# Patient Record
Sex: Female | Born: 1950 | Race: White | Hispanic: No | Smoking: Never smoker
Health system: Southern US, Community
[De-identification: ages and names within clinical notes are randomized; demographics above are authoritative.]

## PROBLEM LIST (undated history)

## (undated) DIAGNOSIS — N200 Calculus of kidney: Secondary | ICD-10-CM

## (undated) DIAGNOSIS — I219 Acute myocardial infarction, unspecified: Secondary | ICD-10-CM

## (undated) DIAGNOSIS — M1990 Unspecified osteoarthritis, unspecified site: Secondary | ICD-10-CM

## (undated) DIAGNOSIS — C439 Malignant melanoma of skin, unspecified: Secondary | ICD-10-CM

## (undated) DIAGNOSIS — I10 Essential (primary) hypertension: Secondary | ICD-10-CM

## (undated) DIAGNOSIS — E785 Hyperlipidemia, unspecified: Secondary | ICD-10-CM

## (undated) DIAGNOSIS — R198 Other specified symptoms and signs involving the digestive system and abdomen: Secondary | ICD-10-CM

## (undated) HISTORY — DX: Malignant melanoma of skin, unspecified: C43.9

## (undated) HISTORY — DX: Other specified symptoms and signs involving the digestive system and abdomen: R19.8

## (undated) HISTORY — DX: Acute myocardial infarction, unspecified: I21.9

## (undated) HISTORY — DX: Essential (primary) hypertension: I10

---

## 2000-09-02 DIAGNOSIS — I10 Essential (primary) hypertension: Secondary | ICD-10-CM

## 2000-09-02 HISTORY — DX: Essential (primary) hypertension: I10

## 2002-09-02 DIAGNOSIS — I219 Acute myocardial infarction, unspecified: Secondary | ICD-10-CM

## 2002-09-02 HISTORY — PX: OTHER SURGICAL HISTORY: SHX169

## 2002-09-02 HISTORY — DX: Acute myocardial infarction, unspecified: I21.9

## 2004-06-02 ENCOUNTER — Encounter: Payer: Self-pay | Admitting: Cardiology

## 2004-07-03 ENCOUNTER — Encounter: Payer: Self-pay | Admitting: Cardiology

## 2004-08-02 ENCOUNTER — Encounter: Payer: Self-pay | Admitting: Cardiology

## 2004-09-02 ENCOUNTER — Encounter: Payer: Self-pay | Admitting: Cardiology

## 2004-09-02 HISTORY — PX: COLONOSCOPY: SHX174

## 2004-09-02 HISTORY — PX: UPPER GI ENDOSCOPY: SHX6162

## 2004-10-03 ENCOUNTER — Encounter: Payer: Self-pay | Admitting: Cardiology

## 2004-10-31 ENCOUNTER — Encounter: Payer: Self-pay | Admitting: Cardiology

## 2004-12-01 ENCOUNTER — Encounter: Payer: Self-pay | Admitting: Cardiology

## 2004-12-31 ENCOUNTER — Encounter: Payer: Self-pay | Admitting: Cardiology

## 2005-01-31 ENCOUNTER — Encounter: Payer: Self-pay | Admitting: Cardiology

## 2005-03-02 ENCOUNTER — Encounter: Payer: Self-pay | Admitting: Cardiology

## 2005-04-02 ENCOUNTER — Encounter: Payer: Self-pay | Admitting: Cardiology

## 2005-05-03 ENCOUNTER — Encounter: Payer: Self-pay | Admitting: Cardiology

## 2005-08-16 ENCOUNTER — Ambulatory Visit: Payer: Self-pay | Admitting: General Surgery

## 2005-08-20 ENCOUNTER — Ambulatory Visit: Payer: Self-pay | Admitting: Internal Medicine

## 2006-08-21 ENCOUNTER — Ambulatory Visit: Payer: Self-pay | Admitting: Internal Medicine

## 2007-08-25 ENCOUNTER — Ambulatory Visit: Payer: Self-pay | Admitting: Internal Medicine

## 2008-08-30 ENCOUNTER — Ambulatory Visit: Payer: Self-pay | Admitting: Internal Medicine

## 2009-01-04 ENCOUNTER — Ambulatory Visit: Payer: Self-pay | Admitting: Dermatology

## 2009-08-13 ENCOUNTER — Ambulatory Visit: Payer: Self-pay | Admitting: Family Medicine

## 2009-09-02 HISTORY — PX: LITHOTRIPSY: SUR834

## 2009-09-04 ENCOUNTER — Ambulatory Visit: Payer: Self-pay | Admitting: Internal Medicine

## 2010-07-02 ENCOUNTER — Emergency Department: Payer: Self-pay | Admitting: Unknown Physician Specialty

## 2010-07-17 ENCOUNTER — Ambulatory Visit: Payer: Self-pay | Admitting: Urology

## 2010-07-30 ENCOUNTER — Ambulatory Visit: Payer: Self-pay | Admitting: Urology

## 2010-08-02 ENCOUNTER — Ambulatory Visit: Payer: Self-pay | Admitting: Urology

## 2010-08-17 ENCOUNTER — Ambulatory Visit: Payer: Self-pay | Admitting: Urology

## 2010-08-21 ENCOUNTER — Ambulatory Visit: Payer: Self-pay | Admitting: Urology

## 2010-08-23 ENCOUNTER — Ambulatory Visit: Payer: Self-pay | Admitting: Urology

## 2010-08-24 ENCOUNTER — Ambulatory Visit: Payer: Self-pay | Admitting: General Surgery

## 2010-08-29 LAB — PATHOLOGY REPORT

## 2010-09-05 ENCOUNTER — Ambulatory Visit: Payer: Self-pay | Admitting: Internal Medicine

## 2010-09-14 ENCOUNTER — Ambulatory Visit: Payer: Self-pay | Admitting: Urology

## 2010-10-16 ENCOUNTER — Ambulatory Visit: Payer: Self-pay | Admitting: Urology

## 2010-10-26 ENCOUNTER — Ambulatory Visit: Payer: Self-pay | Admitting: Urology

## 2011-01-23 ENCOUNTER — Ambulatory Visit: Payer: Self-pay | Admitting: Urology

## 2011-09-09 ENCOUNTER — Ambulatory Visit: Payer: Self-pay | Admitting: Internal Medicine

## 2012-09-09 ENCOUNTER — Ambulatory Visit: Payer: Self-pay | Admitting: Internal Medicine

## 2013-03-18 ENCOUNTER — Encounter: Payer: Self-pay | Admitting: *Deleted

## 2013-09-10 ENCOUNTER — Ambulatory Visit: Payer: Self-pay | Admitting: Internal Medicine

## 2013-11-02 ENCOUNTER — Ambulatory Visit: Payer: Self-pay | Admitting: Urology

## 2013-11-02 DIAGNOSIS — R3129 Other microscopic hematuria: Secondary | ICD-10-CM | POA: Insufficient documentation

## 2013-11-19 DIAGNOSIS — N200 Calculus of kidney: Secondary | ICD-10-CM | POA: Insufficient documentation

## 2014-03-28 ENCOUNTER — Ambulatory Visit: Payer: Self-pay | Admitting: Urology

## 2014-03-31 ENCOUNTER — Ambulatory Visit: Payer: Self-pay | Admitting: Urology

## 2014-07-04 ENCOUNTER — Encounter: Payer: Self-pay | Admitting: *Deleted

## 2014-09-12 ENCOUNTER — Ambulatory Visit: Payer: Self-pay | Admitting: Internal Medicine

## 2014-10-19 ENCOUNTER — Encounter: Payer: Self-pay | Admitting: Podiatry

## 2014-10-19 ENCOUNTER — Ambulatory Visit (INDEPENDENT_AMBULATORY_CARE_PROVIDER_SITE_OTHER): Payer: 59 | Admitting: Podiatry

## 2014-10-19 ENCOUNTER — Ambulatory Visit (INDEPENDENT_AMBULATORY_CARE_PROVIDER_SITE_OTHER): Payer: 59

## 2014-10-19 ENCOUNTER — Other Ambulatory Visit: Payer: Self-pay | Admitting: *Deleted

## 2014-10-19 VITALS — BP 131/89 | HR 67 | Resp 16 | Ht 61.0 in | Wt 192.0 lb

## 2014-10-19 DIAGNOSIS — M722 Plantar fascial fibromatosis: Secondary | ICD-10-CM

## 2014-10-19 MED ORDER — METHYLPREDNISOLONE (PAK) 4 MG PO TABS: ORAL_TABLET | ORAL | Status: DC

## 2014-10-19 NOTE — Patient Instructions (Signed)

## 2014-10-19 NOTE — Progress Notes (Signed)
   Subjective:    Patient ID: Denise Jenkins, female    DOB: 04/12/1951, 64 y.o.   MRN: [id number]  HPI Comments: Left plantar heel pain since before christmas. First step down is painful , advil for pain   Foot Pain      Review of Systems  All other systems reviewed and are negative.      Objective:   Physical Exam: I have reviewed her past medical history medications allergies surgery social history and review of systems. Pulses are strongly palpable bilateral. Neurologic sensorium is intact for Sims once the monofilament. Deep tendon reflexes are intact bilateral and muscle strength is 5 over 5 dorsiflexors plantar flexors and inverters and everters all intrinsic musculature is intact. Orthopedic evaluation demonstrate all joints distal to the ankle before range of motion without crepitation. Cutaneous evaluation demonstrates supple well-hydrated cutis. Pain on palpation medial calcaneal tubercle of the left heel consistent with plantar fasciitis. Radiographs confirm a small plantar distally oriented calcaneal heel spur with a soft tissue increase in density at the plantar fascial calcaneal insertion site.        Assessment & Plan:  Assessment: Plantar fasciitis left foot.  Plan: Started her on a Medrol Dosepak. Injected her left heel today with Kenalog and local anesthetic. Placed her in a plantar fascial brace and a night splint. Discussed appropriate shoe gear stretching exercises ice therapy issue or modifications. We discussed the etiology pathology conservative versus surgical therapies. I will follow-up with her in 1 month.

## 2014-11-16 ENCOUNTER — Ambulatory Visit: Payer: 59 | Admitting: Podiatry

## 2014-12-05 ENCOUNTER — Ambulatory Visit: Payer: 59 | Admitting: Podiatry

## 2015-01-25 ENCOUNTER — Ambulatory Visit (INDEPENDENT_AMBULATORY_CARE_PROVIDER_SITE_OTHER): Payer: 59 | Admitting: Podiatry

## 2015-01-25 ENCOUNTER — Encounter: Payer: Self-pay | Admitting: Podiatry

## 2015-01-25 VITALS — BP 122/70 | HR 62 | Resp 16

## 2015-01-25 DIAGNOSIS — M722 Plantar fascial fibromatosis: Secondary | ICD-10-CM

## 2015-01-25 NOTE — Progress Notes (Signed)
She presents today for follow-up of her plantar fasciitis left heel. She states that it was getting better and then took a turn for the worse. She states that he really never goes away now hurts every step she takes.  Objective: Vital signs are stable she is alert and oriented 3. She takes tumorac as an anti-inflammatory. She doesn't like to take medication. She confirms that she still sleeps with the night splint wear today sling at utilizes ice on occasion and stays in good tennis shoes. She has pain on palpation medial continued tubercle left heel. Pulses remain palpable.  Assessment: Pain limb secondary plantar fasciitis left.  Plan: Reinjected the left heel today with lobular claims that he continue a conservative therapies discussed the need for orthotics we will follow-up with her in the near future.

## 2015-07-21 ENCOUNTER — Other Ambulatory Visit: Payer: Self-pay | Admitting: Internal Medicine

## 2015-07-21 DIAGNOSIS — Z1231 Encounter for screening mammogram for malignant neoplasm of breast: Secondary | ICD-10-CM

## 2015-09-15 ENCOUNTER — Ambulatory Visit
Admission: RE | Admit: 2015-09-15 | Discharge: 2015-09-15 | Disposition: A | Discharge status: Home / Self Care | Payer: 59 | Source: Ambulatory Visit | Attending: Internal Medicine | Admitting: Internal Medicine

## 2015-09-15 DIAGNOSIS — Z1231 Encounter for screening mammogram for malignant neoplasm of breast: Secondary | ICD-10-CM | POA: Diagnosis present

## 2016-04-03 ENCOUNTER — Other Ambulatory Visit: Payer: Self-pay | Admitting: Family

## 2016-04-03 ENCOUNTER — Ambulatory Visit
Admission: AD | Admit: 2016-04-03 | Discharge: 2016-04-03 | Disposition: A | Discharge status: Home / Self Care | Payer: Worker's Compensation | Source: Ambulatory Visit | Attending: Family | Admitting: Family

## 2016-04-03 ENCOUNTER — Ambulatory Visit
Admission: RE | Admit: 2016-04-03 | Discharge: 2016-04-03 | Disposition: A | Discharge status: Home / Self Care | Payer: Worker's Compensation | Source: Ambulatory Visit | Attending: Family | Admitting: Family

## 2016-04-03 DIAGNOSIS — T1490XA Injury, unspecified, initial encounter: Secondary | ICD-10-CM

## 2016-04-03 DIAGNOSIS — M79661 Pain in right lower leg: Secondary | ICD-10-CM | POA: Insufficient documentation

## 2016-07-02 DIAGNOSIS — I10 Essential (primary) hypertension: Secondary | ICD-10-CM | POA: Insufficient documentation

## 2016-07-02 DIAGNOSIS — R0602 Shortness of breath: Secondary | ICD-10-CM | POA: Insufficient documentation

## 2016-07-02 DIAGNOSIS — Z955 Presence of coronary angioplasty implant and graft: Secondary | ICD-10-CM | POA: Insufficient documentation

## 2016-07-02 DIAGNOSIS — I214 Non-ST elevation (NSTEMI) myocardial infarction: Secondary | ICD-10-CM | POA: Insufficient documentation

## 2016-07-22 ENCOUNTER — Other Ambulatory Visit: Payer: Self-pay | Admitting: Internal Medicine

## 2016-07-22 DIAGNOSIS — Z1231 Encounter for screening mammogram for malignant neoplasm of breast: Secondary | ICD-10-CM

## 2016-07-31 ENCOUNTER — Observation Stay
Admission: AD | Admit: 2016-07-31 | Discharge: 2016-08-01 | Disposition: A | Discharge status: Home / Self Care | Payer: 59 | Source: Ambulatory Visit | Attending: Cardiology | Admitting: Cardiology

## 2016-07-31 ENCOUNTER — Encounter: Payer: Self-pay | Admitting: *Deleted

## 2016-07-31 ENCOUNTER — Encounter
Admission: AD | Disposition: A | Discharge status: Home / Self Care | Payer: Self-pay | Source: Ambulatory Visit | Attending: Cardiology

## 2016-07-31 DIAGNOSIS — E785 Hyperlipidemia, unspecified: Secondary | ICD-10-CM | POA: Insufficient documentation

## 2016-07-31 DIAGNOSIS — I252 Old myocardial infarction: Secondary | ICD-10-CM | POA: Diagnosis not present

## 2016-07-31 DIAGNOSIS — I25110 Atherosclerotic heart disease of native coronary artery with unstable angina pectoris: Secondary | ICD-10-CM | POA: Diagnosis not present

## 2016-07-31 DIAGNOSIS — Z85820 Personal history of malignant melanoma of skin: Secondary | ICD-10-CM | POA: Diagnosis not present

## 2016-07-31 DIAGNOSIS — I10 Essential (primary) hypertension: Secondary | ICD-10-CM | POA: Insufficient documentation

## 2016-07-31 DIAGNOSIS — Z955 Presence of coronary angioplasty implant and graft: Secondary | ICD-10-CM | POA: Diagnosis not present

## 2016-07-31 DIAGNOSIS — I2510 Atherosclerotic heart disease of native coronary artery without angina pectoris: Secondary | ICD-10-CM | POA: Diagnosis present

## 2016-07-31 DIAGNOSIS — Z79899 Other long term (current) drug therapy: Secondary | ICD-10-CM | POA: Diagnosis not present

## 2016-07-31 HISTORY — PX: CARDIAC CATHETERIZATION: SHX172

## 2016-07-31 HISTORY — DX: Calculus of kidney: N20.0

## 2016-07-31 HISTORY — DX: Hyperlipidemia, unspecified: E78.5

## 2016-07-31 LAB — CREATININE, SERUM
Creatinine, Ser: 0.7 mg/dL (ref 0.44–1.00)
GFR calc non Af Amer: >60 mL/min (ref >60)

## 2016-07-31 SURGERY — LEFT HEART CATH AND CORONARY ANGIOGRAPHY
Anesthesia: Moderate Sedation

## 2016-07-31 MED ORDER — ASPIRIN 81 MG PO CHEW
CHEWABLE_TABLET | ORAL | Status: AC
  Filled 2016-07-31: qty 4

## 2016-07-31 MED ORDER — SODIUM CHLORIDE 0.9 % IV SOLN
INTRAVENOUS | Status: DC | PRN
  Administered 2016-07-31 (×2): 1.75 mg/kg/h via INTRAVENOUS

## 2016-07-31 MED ORDER — SODIUM CHLORIDE 0.9 % IV SOLN
250.0000 mL | INTRAVENOUS | Status: DC | PRN
Start: 2016-07-31 — End: 2016-08-01

## 2016-07-31 MED ORDER — ONDANSETRON HCL 4 MG/2ML IJ SOLN
INTRAMUSCULAR | Status: AC
  Filled 2016-07-31: qty 2

## 2016-07-31 MED ORDER — ATENOLOL 25 MG PO TABS
25.0000 mg | ORAL_TABLET | Freq: Every day | ORAL | Status: DC
  Administered 2016-08-01: 25 mg via ORAL
  Filled 2016-07-31 (×3): qty 1

## 2016-07-31 MED ORDER — ATROPINE SULFATE 1 MG/10ML IJ SOSY
PREFILLED_SYRINGE | INTRAMUSCULAR | Status: DC | PRN
  Administered 2016-07-31 (×2): 0.5 mg via INTRAVENOUS

## 2016-07-31 MED ORDER — ASPIRIN 81 MG PO CHEW
CHEWABLE_TABLET | ORAL | Status: DC | PRN
  Administered 2016-07-31: 324 mg via ORAL

## 2016-07-31 MED ORDER — CLOPIDOGREL BISULFATE 75 MG PO TABS
ORAL_TABLET | ORAL | Status: AC
  Filled 2016-07-31: qty 8

## 2016-07-31 MED ORDER — LABETALOL HCL 5 MG/ML IV SOLN: 10.0000 mg | INTRAVENOUS | Status: AC | PRN

## 2016-07-31 MED ORDER — CLOPIDOGREL BISULFATE 75 MG PO TABS
75.0000 mg | ORAL_TABLET | Freq: Every day | ORAL | Status: DC
  Administered 2016-08-01: 75 mg via ORAL
  Filled 2016-07-31: qty 1

## 2016-07-31 MED ORDER — SODIUM CHLORIDE 0.9 % WEIGHT BASED INFUSION
1.0000 mL/kg/h | INTRAVENOUS | Status: AC
  Administered 2016-07-31 (×2): 1 mL/kg/h via INTRAVENOUS

## 2016-07-31 MED ORDER — TIROFIBAN HCL IV 12.5 MG/250 ML
INTRAVENOUS | Status: DC | PRN
  Administered 2016-07-31: 0.15 ug/kg/min via INTRAVENOUS

## 2016-07-31 MED ORDER — SODIUM CHLORIDE 0.9 % IV SOLN: 250.0000 mL | INTRAVENOUS | Status: DC | PRN

## 2016-07-31 MED ORDER — IOPAMIDOL (ISOVUE-300) INJECTION 61%
INTRAVENOUS | Status: DC | PRN
  Administered 2016-07-31: 340 mL via INTRA_ARTERIAL

## 2016-07-31 MED ORDER — BIVALIRUDIN 250 MG IV SOLR
INTRAVENOUS | Status: AC
  Filled 2016-07-31: qty 250

## 2016-07-31 MED ORDER — ACETAMINOPHEN 325 MG PO TABS: 650.0000 mg | ORAL_TABLET | ORAL | Status: DC | PRN

## 2016-07-31 MED ORDER — SIMETHICONE 80 MG PO CHEW
80.0000 mg | CHEWABLE_TABLET | Freq: Once | ORAL | Status: AC
  Administered 2016-07-31: 80 mg via ORAL
  Filled 2016-07-31: qty 1

## 2016-07-31 MED ORDER — NITROGLYCERIN 1 MG/10 ML FOR IR/CATH LAB
INTRA_ARTERIAL | Status: DC | PRN
  Administered 2016-07-31: 300 mL via INTRACORONARY
  Administered 2016-07-31 (×3): 200 ug via INTRACORONARY

## 2016-07-31 MED ORDER — HEPARIN (PORCINE) IN NACL 2-0.9 UNIT/ML-% IJ SOLN
INTRAMUSCULAR | Status: AC
  Filled 2016-07-31: qty 500

## 2016-07-31 MED ORDER — SODIUM CHLORIDE 0.9 % WEIGHT BASED INFUSION: 1.0000 mL/kg/h | INTRAVENOUS | Status: DC

## 2016-07-31 MED ORDER — BIVALIRUDIN BOLUS VIA INFUSION - CUPID
INTRAVENOUS | Status: DC | PRN
  Administered 2016-07-31: 71.775 mg via INTRAVENOUS

## 2016-07-31 MED ORDER — SODIUM CHLORIDE 0.9% FLUSH: 3.0000 mL | INTRAVENOUS | Status: DC | PRN

## 2016-07-31 MED ORDER — MIDAZOLAM HCL 2 MG/2ML IJ SOLN
INTRAMUSCULAR | Status: AC
  Filled 2016-07-31: qty 2

## 2016-07-31 MED ORDER — ONDANSETRON HCL 4 MG/2ML IJ SOLN
4.0000 mg | Freq: Four times a day (QID) | INTRAMUSCULAR | Status: DC | PRN
  Administered 2016-07-31: 4 mg via INTRAVENOUS

## 2016-07-31 MED ORDER — ASPIRIN 81 MG PO CHEW
81.0000 mg | CHEWABLE_TABLET | Freq: Every day | ORAL | Status: DC
  Administered 2016-08-01: 81 mg via ORAL
  Filled 2016-07-31: qty 1

## 2016-07-31 MED ORDER — FENTANYL CITRATE (PF) 100 MCG/2ML IJ SOLN
INTRAMUSCULAR | Status: AC
  Filled 2016-07-31: qty 2

## 2016-07-31 MED ORDER — MIDAZOLAM HCL 2 MG/2ML IJ SOLN
INTRAMUSCULAR | Status: DC | PRN
  Administered 2016-07-31 (×2): 1 mg via INTRAVENOUS

## 2016-07-31 MED ORDER — TIROFIBAN HCL IN NACL 5-0.9 MG/100ML-% IV SOLN
0.1500 ug/kg/min | INTRAVENOUS | Status: AC
  Administered 2016-07-31 – 2016-08-01 (×3): 0.15 ug/kg/min via INTRAVENOUS
  Filled 2016-07-31 (×5): qty 100

## 2016-07-31 MED ORDER — CLOPIDOGREL BISULFATE 75 MG PO TABS
ORAL_TABLET | ORAL | Status: DC | PRN
  Administered 2016-07-31: 600 mg via ORAL

## 2016-07-31 MED ORDER — SODIUM CHLORIDE 0.9% FLUSH
3.0000 mL | Freq: Two times a day (BID) | INTRAVENOUS | Status: DC
  Administered 2016-07-31 – 2016-08-01 (×3): 3 mL via INTRAVENOUS

## 2016-07-31 MED ORDER — ROSUVASTATIN CALCIUM 20 MG PO TABS
20.0000 mg | ORAL_TABLET | Freq: Every day | ORAL | Status: DC
  Administered 2016-07-31: 20 mg via ORAL
  Filled 2016-07-31: qty 1

## 2016-07-31 MED ORDER — ASPIRIN 81 MG PO CHEW: 81.0000 mg | CHEWABLE_TABLET | ORAL | Status: DC

## 2016-07-31 MED ORDER — EZETIMIBE 10 MG PO TABS
10.0000 mg | ORAL_TABLET | Freq: Every day | ORAL | Status: DC
  Administered 2016-07-31 – 2016-08-01 (×2): 10 mg via ORAL
  Filled 2016-07-31 (×4): qty 1

## 2016-07-31 MED ORDER — SODIUM CHLORIDE 0.9 % WEIGHT BASED INFUSION: 3.0000 mL/kg/h | INTRAVENOUS | Status: DC

## 2016-07-31 MED ORDER — SIMETHICONE 80 MG PO CHEW
80.0000 mg | CHEWABLE_TABLET | Freq: Four times a day (QID) | ORAL | Status: DC | PRN
  Administered 2016-07-31 – 2016-08-01 (×2): 80 mg via ORAL
  Filled 2016-07-31 (×2): qty 1

## 2016-07-31 MED ORDER — SODIUM CHLORIDE 0.9% FLUSH: 3.0000 mL | Freq: Two times a day (BID) | INTRAVENOUS | Status: DC

## 2016-07-31 MED ORDER — FENTANYL CITRATE (PF) 100 MCG/2ML IJ SOLN
INTRAMUSCULAR | Status: DC | PRN
  Administered 2016-07-31 (×2): 25 ug via INTRAVENOUS

## 2016-07-31 MED ORDER — ONDANSETRON HCL 4 MG/2ML IJ SOLN
INTRAMUSCULAR | Status: AC
  Administered 2016-07-31: 4 mg via INTRAVENOUS
  Filled 2016-07-31: qty 2

## 2016-07-31 MED ORDER — HYDRALAZINE HCL 20 MG/ML IJ SOLN: 5.0000 mg | INTRAMUSCULAR | Status: AC | PRN

## 2016-07-31 MED ORDER — NITROGLYCERIN 5 MG/ML IV SOLN
INTRAVENOUS | Status: AC
  Filled 2016-07-31: qty 10

## 2016-07-31 MED ORDER — ATROPINE SULFATE 1 MG/10ML IJ SOSY
PREFILLED_SYRINGE | INTRAMUSCULAR | Status: AC
  Filled 2016-07-31: qty 10

## 2016-07-31 MED ORDER — MELOXICAM 7.5 MG PO TABS
15.0000 mg | ORAL_TABLET | Freq: Every day | ORAL | Status: DC
  Administered 2016-08-01: 15 mg via ORAL
  Filled 2016-07-31: qty 2
  Filled 2016-07-31: qty 1

## 2016-07-31 SURGICAL SUPPLY — 23 items
BALLN MINITREK RX 2.0X20 (BALLOONS) ×4
BALLN TREK RX 2.25X20 (BALLOONS) ×4
BALLOON MINITREK RX 2.0X20 (BALLOONS) ×2 IMPLANT
BALLOON TREK RX 2.25X20 (BALLOONS) ×2 IMPLANT
CATH 5FR JL4 DIAGNOSTIC (CATHETERS) ×4 IMPLANT
CATH 5FR JR4 DIAGNOSTIC (CATHETERS) ×3 IMPLANT
CATH 5FR PIGTAIL DIAGNOSTIC (CATHETERS) ×3 IMPLANT
CATH VISTA GUIDE 6FR JL3.5 (CATHETERS) ×4 IMPLANT
CATH VISTA GUIDE 6FR XBLAD3.5 (CATHETERS) ×4 IMPLANT
DEVICE CLOSURE MYNXGRIP 6/7F (Vascular Products) ×4 IMPLANT
DEVICE INFLAT 30 PLUS (MISCELLANEOUS) ×4 IMPLANT
KIT MANI 3VAL PERCEP (MISCELLANEOUS) ×4 IMPLANT
NEEDLE PERC 18GX7CM (NEEDLE) ×4 IMPLANT
PACK CARDIAC CATH (CUSTOM PROCEDURE TRAY) ×4 IMPLANT
SHEATH AVANTI 5FR X 11CM (SHEATH) ×4 IMPLANT
SHEATH AVANTI 6FR X 11CM (SHEATH) ×4 IMPLANT
STENT RESOLUTE INTEG 2.25X22 (Permanent Stent) ×4 IMPLANT
STENT RESOLUTE INTEG 2.5X30 (Permanent Stent) ×4 IMPLANT
WIRE ASAHI PROWATER 180CM (WIRE) ×4 IMPLANT
WIRE EMERALD 3MM-J .035X150CM (WIRE) ×4 IMPLANT
WIRE G HI TQ BMW 190 (WIRE) ×8 IMPLANT
WIRE HITORQ VERSACORE ST 145CM (WIRE) ×4 IMPLANT
WIRE ROSEN-J .035X260CM (WIRE) IMPLANT

## 2016-07-31 NOTE — Consult Note (Signed)
aggrastat 0.15 mcg/kg/min ordered to run for 18 hours post PCI per consult. Pt crcl is >46ml/min and weighs 95.7kg.  Ramond Dial, Pharm.D Clinical Pharmacist

## 2016-08-01 DIAGNOSIS — I25110 Atherosclerotic heart disease of native coronary artery with unstable angina pectoris: Secondary | ICD-10-CM | POA: Diagnosis not present

## 2016-08-01 LAB — CBC
HEMATOCRIT: 39.4 % (ref 35.0–47.0)
Hemoglobin: 13.9 g/dL (ref 12.0–16.0)
MCH: 31.7 pg (ref 26.0–34.0)
MCHC: 35.4 g/dL (ref 32.0–36.0)
MCV: 89.5 fL (ref 80.0–100.0)
PLATELETS: 187 10*3/uL (ref 150–440)
RBC: 4.4 MIL/uL (ref 3.80–5.20)
RDW: 12.7 % (ref 11.5–14.5)
WBC: 8.9 10*3/uL (ref 3.6–11.0)

## 2016-08-01 LAB — BASIC METABOLIC PANEL
Anion gap: 6 (ref 5–15)
BUN: 11 mg/dL (ref 6–20)
CHLORIDE: 108 mmol/L (ref 101–111)
CO2: 26 mmol/L (ref 22–32)
CREATININE: 0.74 mg/dL (ref 0.44–1.00)
Calcium: 8.7 mg/dL — ABNORMAL LOW (ref 8.9–10.3)
GFR calc Af Amer: >60 mL/min (ref >60)
GFR calc non Af Amer: >60 mL/min (ref >60)
Glucose, Bld: 99 mg/dL (ref 65–99)
Potassium: 3.6 mmol/L (ref 3.5–5.1)
Sodium: 140 mmol/L (ref 135–145)

## 2016-08-01 MED ORDER — CLOPIDOGREL BISULFATE 75 MG PO TABS: 75.0000 mg | ORAL_TABLET | Freq: Every day | ORAL | 5 refills | Status: DC

## 2016-08-01 NOTE — Discharge Summary (Signed)
   Physician Discharge Summary  Patient ID: Denise Jenkins MRN: [id number] DOB/AGE: Jan 31, 1951 65 y.o.  Admit date: 07/31/2016 Discharge date: 08/01/2016  Primary Discharge Diagnosis Unstable angina Secondary Discharge Diagnosis coronary artery disease  Significant Diagnostic Studies: cardiac graphics: ECG: normal sinus rhythm and yes  Consults: None  Hospital Course: The patient underwent elective cardiac catheterization on 07/31/2016. Coronary angiography revealed patent stents in the mid left circumflex and mid right coronary arteries. A new high-grade diffuse 90% stenosis was noted in the mid left anterior descending coronary artery. The patient underwent percutaneous coronary intervention receiving a 2.25 x 22 and 2.5 x 30 mm Resolute Integrity stents with an excellent angiographic result. The patient was observed on telemetry where she had an uncomplicated hospital course without recurrent chest pain. On the morning of 08/01/2016, the patient was ambulating without difficulty was discharged home.   Discharge Exam: Blood pressure 121/68, pulse 63, temperature 98 F (36.7 C), resp. rate 16, height 5\' 1"  (1.549 m), weight 95.7 kg (211 lb), SpO2 97 %.   General appearance: alert Head: atraumatic Eyes: conjunctivae/corneas clear. PERRL, EOM's intact. Fundi benign. Ears: normal TM's and external ear canals both ears Nose: Nares normal. Septum midline. Mucosa normal. No drainage or sinus tenderness. Throat: lips, mucosa, and tongue normal; teeth and gums normal Neck: no adenopathy, no carotid bruit, no JVD, supple, symmetrical, trachea midline and thyroid not enlarged, symmetric, no tenderness/mass/nodules Back: symmetric, no curvature. ROM normal. No CVA tenderness. Resp: clear to auscultation bilaterally Chest wall: no tenderness Cardio: regular rate and rhythm, S1, S2 normal, no murmur, click, rub or gallop GI: soft, non-tender; bowel sounds normal; no masses,  no  organomegaly Extremities: extremities normal, atraumatic, no cyanosis or edema Pulses: 2+ and symmetric Lymph nodes: Cervical, supraclavicular, and axillary nodes normal. Neurologic: Grossly normal Labs:   Lab Results  Component Value Date   WBC 8.9 08/01/2016   HGB 13.9 08/01/2016   HCT 39.4 08/01/2016   MCV 89.5 08/01/2016   PLT 187 08/01/2016    Recent Labs Lab 08/01/16 0445  NA 140  K 3.6  CL 108  CO2 26  BUN 11  CREATININE 0.74  CALCIUM 8.7*  GLUCOSE 99      Radiology:  EKG: Normal sinus rhythm  FOLLOW UP PLANS AND APPOINTMENTS     BRING ALL MEDICATIONS WITH YOU TO FOLLOW UP APPOINTMENTS  Time spent with patient to include physician time: 25 minutes Signed:  Isaias Cowman MD, PhD, [facility] 08/01/2016, 7:21 AM

## 2016-08-01 NOTE — Care Management (Signed)
Patient is not being discharged home on cost prohibitive anti platelet medication

## 2016-09-16 ENCOUNTER — Ambulatory Visit: Payer: 59

## 2016-09-17 ENCOUNTER — Ambulatory Visit
Admission: RE | Admit: 2016-09-17 | Discharge: 2016-09-17 | Disposition: A | Discharge status: Home / Self Care | Payer: 59 | Source: Ambulatory Visit | Attending: Internal Medicine | Admitting: Internal Medicine

## 2016-09-17 DIAGNOSIS — Z1231 Encounter for screening mammogram for malignant neoplasm of breast: Secondary | ICD-10-CM | POA: Diagnosis present

## 2017-04-09 DIAGNOSIS — M179 Osteoarthritis of knee, unspecified: Secondary | ICD-10-CM | POA: Insufficient documentation

## 2017-04-09 DIAGNOSIS — M1710 Unilateral primary osteoarthritis, unspecified knee: Secondary | ICD-10-CM | POA: Insufficient documentation

## 2017-07-30 ENCOUNTER — Other Ambulatory Visit: Payer: Self-pay | Admitting: Internal Medicine

## 2017-07-30 DIAGNOSIS — Z1231 Encounter for screening mammogram for malignant neoplasm of breast: Secondary | ICD-10-CM

## 2017-09-22 ENCOUNTER — Ambulatory Visit
Admission: RE | Admit: 2017-09-22 | Discharge: 2017-09-22 | Disposition: A | Discharge status: Home / Self Care | Payer: Medicare Other | Source: Ambulatory Visit | Attending: Internal Medicine | Admitting: Internal Medicine

## 2017-09-22 DIAGNOSIS — Z1231 Encounter for screening mammogram for malignant neoplasm of breast: Secondary | ICD-10-CM | POA: Insufficient documentation

## 2018-07-04 ENCOUNTER — Encounter: Payer: Self-pay | Admitting: General Surgery

## 2018-08-03 ENCOUNTER — Other Ambulatory Visit: Payer: Self-pay | Admitting: Internal Medicine

## 2018-08-03 DIAGNOSIS — Z1231 Encounter for screening mammogram for malignant neoplasm of breast: Secondary | ICD-10-CM

## 2018-08-06 ENCOUNTER — Ambulatory Visit: Payer: Medicare Other | Admitting: General Surgery

## 2018-08-13 ENCOUNTER — Encounter: Payer: Self-pay | Admitting: General Surgery

## 2018-08-13 ENCOUNTER — Other Ambulatory Visit: Payer: Self-pay

## 2018-08-13 ENCOUNTER — Ambulatory Visit (INDEPENDENT_AMBULATORY_CARE_PROVIDER_SITE_OTHER): Payer: Medicare Other | Admitting: General Surgery

## 2018-08-13 VITALS — BP 176/93 | HR 51 | Temp 97.7°F | Resp 20 | Ht 65.0 in | Wt 220.0 lb

## 2018-08-13 DIAGNOSIS — Z86010 Personal history of colonic polyps: Secondary | ICD-10-CM | POA: Diagnosis not present

## 2018-08-13 MED ORDER — POLYETHYLENE GLYCOL 3350 17 GM/SCOOP PO POWD: ORAL | 0 refills | Status: DC

## 2018-08-13 NOTE — Progress Notes (Signed)
Patient ID: Denise Jenkins, female   DOB: 03/24/1951, 68 y.o.   MRN: [id number]  Chief Complaint  Patient presents with  . Colonoscopy    HPI Denise Jenkins is a 67 y.o. female here today for a evaluating of a colonoscopy. Last colonoscopy was 08/24/2010. Patient states no Gi problems at this time. Moves her bowels daily . HPI  Past Medical History:  Diagnosis Date  . Heart attack ([place]) 2004  . Hyperlipidemia   . Hypertension 2002  . Kidney stones   . Melanoma ([place])   . Myocardial infarction ([place]) 2004  . Other symptoms involving digestive system(787.99)     Past Surgical History:  Procedure Laterality Date  . CARDIAC CATHETERIZATION Left 07/31/2016   Procedure: Left Heart Cath and Coronary Angiography;  Surgeon: Isaias Cowman, MD;  Location: [facility];  Service: Cardiovascular;  Laterality: Left;  . CARDIAC CATHETERIZATION N/A 07/31/2016   Procedure: Coronary Stent Intervention;  Surgeon: Isaias Cowman, MD;  Location: [facility];  Service: Cardiovascular;  Laterality: N/A;  . COLONOSCOPY  2006  . LITHOTRIPSY  2011  . stent placed in heart   2004   non-Q-wave infarction  . UPPER GI ENDOSCOPY  2006    Family History  Problem Relation Age of Onset  . Colon polyps Mother   . Colon polyps Maternal Uncle   . Breast cancer Neg Hx     Social History Social History   Tobacco Use  . Smoking status: Never Smoker  . Smokeless tobacco: Never Used  Substance Use Topics  . Alcohol use: No    Alcohol/week: 0.0 standard drinks  . Drug use: No    Allergies  Allergen Reactions  . Niacin And Related Itching  . Oxycodone Other (See Comments)    Felt "drunk"  . Sulfa Antibiotics Itching    Current Outpatient Medications  Medication Sig Dispense Refill  . atorvastatin (LIPITOR) 40 MG tablet Take 40 mg by mouth daily.    . cholecalciferol (VITAMIN D) 400 units TABS tablet Take 800 Units by mouth daily.    . clopidogrel (PLAVIX) 75 MG tablet  Take 1 tablet (75 mg total) by mouth daily with breakfast. 30 tablet 5  . Coenzyme Q10 (CO Q-10) 100 MG CAPS Take by mouth.    . desonide (DESOWEN) 0.05 % cream Apply 1 application topically 2 (two) times daily as needed.    . ezetimibe (ZETIA) 10 MG tablet Take 10 mg by mouth daily.     . meloxicam (MOBIC) 15 MG tablet Take 15 mg by mouth daily.    . mometasone (ELOCON) 0.1 % cream Apply 1 application topically daily as needed.    . Multiple Vitamin (MULTI-VITAMINS) TABS Take 1 tablet by mouth daily.     . nitroGLYCERIN (NITROSTAT) 0.4 MG SL tablet Place 0.4 mg under the tongue every 5 (five) minutes as needed for chest pain.     Marland Kitchen triamcinolone cream (KENALOG) 0.1 % Apply 1 application topically 2 (two) times daily as needed.    . polyethylene glycol powder (GLYCOLAX/MIRALAX) powder 255 grams one bottle for colonoscopy prep 255 g 0   No current facility-administered medications for this visit.     Review of Systems Review of Systems  Blood pressure (!) 176/93, pulse (!) 51, temperature 97.7 F (36.5 C), temperature source Skin, resp. rate 20, height 5\' 5"  (1.651 m), weight 220 lb (99.8 kg), SpO2 96 %.  Physical Exam Physical Exam Eyes:     General: No scleral  icterus.    Conjunctiva/sclera: Conjunctivae normal.  Cardiovascular:     Rate and Rhythm: Normal rate and regular rhythm.  Pulmonary:     Effort: Pulmonary effort is normal.     Breath sounds: Normal breath sounds.  Skin:    General: Skin is warm and dry.  Neurological:     Mental Status: She is alert and oriented to person, place, and time.     Data Reviewed August 24, 2010 colonoscopy showed a single angiodysplasia in the splenic flexure.  Prior exam in December 2006 had shown a single polyp in the rectum, pathology showed a tubular adenoma.  Upper endoscopy completed in 2006 showed inactive chronic gastritis without evidence of H. pylori infection.  Assessment    Candidate for screening colonoscopy.     Plan  Colonoscopy with possible biopsy/polypectomy prn: Information regarding the procedure, including its potential risks and complications (including but not limited to perforation of the bowel, which may require emergency surgery to repair, and bleeding) was verbally given to the patient. Educational information regarding lower intestinal endoscopy was given to the patient. Written instructions for how to complete the bowel prep using Miralax were provided. The importance of drinking ample fluids to avoid dehydration as a result of the prep emphasized.   HPI, Physical Exam, Assessment and Plan have been scribed under the direction and in the presence of Hervey Ard, MD.  Gaspar Cola, CMA  I have completed the exam and reviewed the above documentation for accuracy and completeness.  I agree with the above.  Haematologist has been used and any errors in dictation or transcription are unintentional.  Hervey Ard, M.D., F.A.C.S.  Forest Gleason Ismaeel Arvelo 08/15/2018, 9:48 AM  Patient has been scheduled for a colonoscopy on 09-09-2018 at [facility]. The patient has been asked to stop Plavix seven days prior to procedure. Miralax prescription has been sent in to the patient's pharmacy today. She has been asked to take Dulcolax 10 mg the morning and afternoon two days prior to colonoscopy (Monday, 09-07-2018). Colonoscopy instructions have been reviewed with the patient. This patient is aware to call the office if they have further questions.   Dominga Ferry, CMA

## 2018-08-13 NOTE — Patient Instructions (Signed)
Colonoscopy, Adult A colonoscopy is an exam to look at the entire large intestine. During the exam, a lubricated, bendable tube is inserted into the anus and then passed into the rectum, colon, and other parts of the large intestine. A colonoscopy is often done as a part of normal colorectal screening or in response to certain symptoms, such as anemia, persistent diarrhea, abdominal pain, and blood in the stool. The exam can help screen for and diagnose medical problems, including:  Tumors.  Polyps.  Inflammation.  Areas of bleeding.  Tell a health care provider about:  Any allergies you have.  All medicines you are taking, including vitamins, herbs, eye drops, creams, and over-the-counter medicines.  Any problems you or family members have had with anesthetic medicines.  Any blood disorders you have.  Any surgeries you have had.  Any medical conditions you have.  Any problems you have had passing stool. What are the risks? Generally, this is a safe procedure. However, problems may occur, including:  Bleeding.  A tear in the intestine.  A reaction to medicines given during the exam.  Infection (rare).  What happens before the procedure? Eating and drinking restrictions Follow instructions from your health care provider about eating and drinking, which may include:  A few days before the procedure - follow a low-fiber diet. Avoid nuts, seeds, dried fruit, raw fruits, and vegetables.  1-3 days before the procedure - follow a clear liquid diet. Drink only clear liquids, such as clear broth or bouillon, black coffee or tea, clear juice, clear soft drinks or sports drinks, gelatin dessert, and popsicles. Avoid any liquids that contain red or purple dye.  On the day of the procedure - do not eat or drink anything during the 2 hours before the procedure, or within the time period that your health care provider recommends.  Bowel prep If you were prescribed an oral bowel prep  to clean out your colon:  Take it as told by your health care provider. Starting the day before your procedure, you will need to drink a large amount of medicated liquid. The liquid will cause you to have multiple loose stools until your stool is almost clear or light green.  If your skin or anus gets irritated from diarrhea, you may use these to relieve the irritation: ? Medicated wipes, such as adult wet wipes with aloe and vitamin E. ? A skin soothing-product like petroleum jelly.  If you vomit while drinking the bowel prep, take a break for up to 60 minutes and then begin the bowel prep again. If vomiting continues and you cannot take the bowel prep without vomiting, call your health care provider.  General instructions  Ask your health care provider about changing or stopping your regular medicines. This is especially important if you are taking diabetes medicines or blood thinners.  Plan to have someone take you home from the hospital or clinic. What happens during the procedure?  An IV tube may be inserted into one of your veins.  You will be given medicine to help you relax (sedative).  To reduce your risk of infection: ? Your health care team will wash or sanitize their hands. ? Your anal area will be washed with soap.  You will be asked to lie on your side with your knees bent.  Your health care provider will lubricate a long, thin, flexible tube. The tube will have a camera and a light on the end.  The tube will be inserted into your   anus.  The tube will be gently eased through your rectum and colon.  Air will be delivered into your colon to keep it open. You may feel some pressure or cramping.  The camera will be used to take images during the procedure.  A small tissue sample may be removed from your body to be examined under a microscope (biopsy). If any potential problems are found, the tissue will be sent to a lab for testing.  If small polyps are found, your  health care provider may remove them and have them checked for cancer cells.  The tube that was inserted into your anus will be slowly removed. The procedure may vary among health care providers and hospitals. What happens after the procedure?  Your blood pressure, heart rate, breathing rate, and blood oxygen level will be monitored until the medicines you were given have worn off.  Do not drive for 24 hours after the exam.  You may have a small amount of blood in your stool.  You may pass gas and have mild abdominal cramping or bloating due to the air that was used to inflate your colon during the exam.  It is up to you to get the results of your procedure. Ask your health care provider, or the department performing the procedure, when your results will be ready. This information is not intended to replace advice given to you by your health care provider. Make sure you discuss any questions you have with your health care provider. Document Released: 08/16/2000 Document Revised: 06/19/2016 Document Reviewed: 10/31/2015 Elsevier Interactive Patient Education  2018 Elsevier Inc.  

## 2018-08-19 ENCOUNTER — Telehealth: Payer: Self-pay

## 2018-08-19 NOTE — Telephone Encounter (Signed)
Call to patient to reschedule their colonoscopy currently scheduled for 09/09/18.  Patient is now scheduled for a colonoscopy on 2/08-2019 at [facility]. The patient has been asked to stop Plavix seven days prior to procedure. Miralax prescription has been sent in to the patient's pharmacy today. She has been asked to take Dulcolax 10 mg the morning and afternoon two days prior to colonoscopy (Monday, 10/12/2018). Colonoscopy instructions have been reviewed with the patient. This patient is aware to call the office if they have further questions.

## 2018-08-20 ENCOUNTER — Encounter: Payer: Self-pay | Admitting: General Surgery

## 2018-09-23 ENCOUNTER — Other Ambulatory Visit: Payer: Self-pay

## 2018-09-23 ENCOUNTER — Telehealth: Payer: Self-pay | Admitting: *Deleted

## 2018-09-23 DIAGNOSIS — Z86010 Personal history of colonic polyps: Secondary | ICD-10-CM

## 2018-09-23 NOTE — Telephone Encounter (Signed)
Patient contacted today and notified that Dr. Bary Castilla is on leave with no anticipated RTW date. She is aware we need to cancel her colonoscopy that is scheduled for 10-14-18.  Trish in Endoscopy has been notified of cancellation.   A referral has been for the patient to see [facility] for colonoscopy. Patient aware they will contact her directly regarding an appointment.

## 2018-09-24 ENCOUNTER — Telehealth: Payer: Self-pay | Admitting: *Deleted

## 2018-09-24 ENCOUNTER — Ambulatory Visit
Admission: RE | Admit: 2018-09-24 | Discharge: 2018-09-24 | Disposition: A | Discharge status: Home / Self Care | Payer: Medicare Other | Source: Ambulatory Visit | Attending: Internal Medicine | Admitting: Internal Medicine

## 2018-09-24 DIAGNOSIS — Z1231 Encounter for screening mammogram for malignant neoplasm of breast: Secondary | ICD-10-CM

## 2018-09-24 NOTE — Telephone Encounter (Signed)
Patient notified that Dr. Bary Castilla will be returning next week.   She was given the option to have colonoscopy with Dr. Bary Castilla or Dr. Marius Ditch as scheduled.   Patient is appreciative of the call but she wishes to see Dr. Marius Ditch since she can have procedure done in [place].

## 2018-09-30 ENCOUNTER — Telehealth: Payer: Self-pay

## 2018-09-30 NOTE — Telephone Encounter (Signed)
Patient has been advised per Dr. Suzan Garibaldi fax to stop Plavix 5 days prior to procedure and restart 1 day after procedure.  Thanks [employer]

## 2018-10-06 ENCOUNTER — Encounter: Payer: Self-pay | Admitting: *Deleted

## 2018-10-06 ENCOUNTER — Other Ambulatory Visit: Payer: Self-pay

## 2018-10-08 ENCOUNTER — Telehealth: Payer: Self-pay | Admitting: Gastroenterology

## 2018-10-08 ENCOUNTER — Other Ambulatory Visit: Payer: Self-pay

## 2018-10-08 MED ORDER — NA SULFATE-K SULFATE-MG SULF 17.5-3.13-1.6 GM/177ML PO SOLN: 1.0000 | Freq: Once | ORAL | 0 refills | Status: AC

## 2018-10-08 NOTE — Telephone Encounter (Signed)
PT left vm she has procedure on 10/14/18 and her pharmacy [facility] in [place] does not have the Suprep

## 2018-10-08 NOTE — Anesthesia Preprocedure Evaluation (Addendum)
Anesthesia Evaluation  Patient identified by MRN, date of birth, ID band  Reviewed: NPO status   History of Anesthesia Complications Negative for: history of anesthetic complications  Airway Mallampati: II  TM Distance: >3 FB Neck ROM: full    Dental no notable dental hx.    Pulmonary neg pulmonary ROS,    Pulmonary exam normal        Cardiovascular Exercise Tolerance: Good hypertension, + CAD, + Past MI (2004) and + Cardiac Stents (x4 (last 2017))  Normal cardiovascular exam  Last Cardiology visit 08/2018.  Doing well LVEF > 55%   Neuro/Psych negative neurological ROS  negative psych ROS   GI/Hepatic negative GI ROS, Neg liver ROS,   Endo/Other  Morbid obesity (bmi=40)  Renal/GU Renal disease (stones)  negative genitourinary   Musculoskeletal  (+) Arthritis ,   Abdominal   Peds  Hematology negative hematology ROS (+)   Anesthesia Other Findings Cards: 08/2018: 68 year old female status post PCI with overlapping drug-eluting stents mid LAD, with 2 Cypher stents in 2004, currently clinically stable without exertional chest pain. Patient has essential hypertension, blood pressure well controlled on current BP medications.   Last plavix: 10/10/2018;  Reproductive/Obstetrics                            Anesthesia Physical Anesthesia Plan  ASA: III  Anesthesia Plan: General   Post-op Pain Management:    Induction:   PONV Risk Score and Plan:   Airway Management Planned: Natural Airway  Additional Equipment:   Intra-op Plan:   Post-operative Plan:   Informed Consent: I have reviewed the patients History and Physical, chart, labs and discussed the procedure including the risks, benefits and alternatives for the proposed anesthesia with the patient or authorized representative who has indicated his/her understanding and acceptance.       Plan Discussed with: CRNA  Anesthesia  Plan Comments:         Anesthesia Quick Evaluation

## 2018-10-08 NOTE — Telephone Encounter (Signed)
Rx sent to pharmacy for [place].  Thanks [employer]

## 2018-10-13 NOTE — Discharge Instructions (Signed)
General Anesthesia, Adult, Care After  This sheet gives you information about how to care for yourself after your procedure. Your health care provider may also give you more specific instructions. If you have problems or questions, contact your health care provider.  What can I expect after the procedure?  After the procedure, the following side effects are common:  Pain or discomfort at the IV site.  Nausea.  Vomiting.  Sore throat.  Trouble concentrating.  Feeling cold or chills.  Weak or tired.  Sleepiness and fatigue.  Soreness and body aches. These side effects can affect parts of the body that were not involved in surgery.  Follow these instructions at home:    For at least 24 hours after the procedure:  Have a responsible adult stay with you. It is important to have someone help care for you until you are awake and alert.  Rest as needed.  Do not:  Participate in activities in which you could fall or become injured.  Drive.  Use heavy machinery.  Drink alcohol.  Take sleeping pills or medicines that cause drowsiness.  Make important decisions or sign legal documents.  Take care of children on your own.  Eating and drinking  Follow any instructions from your health care provider about eating or drinking restrictions.  When you feel hungry, start by eating small amounts of foods that are soft and easy to digest (bland), such as toast. Gradually return to your regular diet.  Drink enough fluid to keep your urine pale yellow.  If you vomit, rehydrate by drinking water, juice, or clear broth.  General instructions  If you have sleep apnea, surgery and certain medicines can increase your risk for breathing problems. Follow instructions from your health care provider about wearing your sleep device:  Anytime you are sleeping, including during daytime naps.  While taking prescription pain medicines, sleeping medicines, or medicines that make you drowsy.  Return to your normal activities as told by your health care  provider. Ask your health care provider what activities are safe for you.  Take over-the-counter and prescription medicines only as told by your health care provider.  If you smoke, do not smoke without supervision.  Keep all follow-up visits as told by your health care provider. This is important.  Contact a health care provider if:  You have nausea or vomiting that does not get better with medicine.  You cannot eat or drink without vomiting.  You have pain that does not get better with medicine.  You are unable to pass urine.  You develop a skin rash.  You have a fever.  You have redness around your IV site that gets worse.  Get help right away if:  You have difficulty breathing.  You have chest pain.  You have blood in your urine or stool, or you vomit blood.  Summary  After the procedure, it is common to have a sore throat or nausea. It is also common to feel tired.  Have a responsible adult stay with you for the first 24 hours after general anesthesia. It is important to have someone help care for you until you are awake and alert.  When you feel hungry, start by eating small amounts of foods that are soft and easy to digest (bland), such as toast. Gradually return to your regular diet.  Drink enough fluid to keep your urine pale yellow.  Return to your normal activities as told by your health care provider. Ask your health care   provider what activities are safe for you.  This information is not intended to replace advice given to you by your health care provider. Make sure you discuss any questions you have with your health care provider.  Document Released: 11/25/2000 Document Revised: 04/04/2017 Document Reviewed: 04/04/2017  Elsevier Interactive Patient Education  2019 Elsevier Inc.

## 2018-10-14 ENCOUNTER — Ambulatory Visit
Admission: RE | Admit: 2018-10-14 | Discharge: 2018-10-14 | Disposition: A | Discharge status: Home / Self Care | Payer: Medicare Other | Attending: Gastroenterology | Admitting: Gastroenterology

## 2018-10-14 ENCOUNTER — Encounter: Payer: Self-pay | Admitting: Anesthesiology

## 2018-10-14 ENCOUNTER — Ambulatory Visit: Payer: Medicare Other | Admitting: Anesthesiology

## 2018-10-14 ENCOUNTER — Ambulatory Visit: Admit: 2018-10-14 | Payer: PRIVATE HEALTH INSURANCE | Admitting: General Surgery

## 2018-10-14 ENCOUNTER — Encounter
Admission: RE | Disposition: A | Discharge status: Home / Self Care | Payer: Self-pay | Source: Home / Self Care | Attending: Gastroenterology

## 2018-10-14 DIAGNOSIS — E785 Hyperlipidemia, unspecified: Secondary | ICD-10-CM | POA: Diagnosis not present

## 2018-10-14 DIAGNOSIS — Z7902 Long term (current) use of antithrombotics/antiplatelets: Secondary | ICD-10-CM | POA: Diagnosis not present

## 2018-10-14 DIAGNOSIS — Z6841 Body Mass Index (BMI) 40.0 and over, adult: Secondary | ICD-10-CM | POA: Diagnosis not present

## 2018-10-14 DIAGNOSIS — Z791 Long term (current) use of non-steroidal anti-inflammatories (NSAID): Secondary | ICD-10-CM | POA: Diagnosis not present

## 2018-10-14 DIAGNOSIS — Z885 Allergy status to narcotic agent status: Secondary | ICD-10-CM | POA: Insufficient documentation

## 2018-10-14 DIAGNOSIS — D120 Benign neoplasm of cecum: Secondary | ICD-10-CM

## 2018-10-14 DIAGNOSIS — Z86010 Personal history of colonic polyps: Secondary | ICD-10-CM

## 2018-10-14 DIAGNOSIS — Z1211 Encounter for screening for malignant neoplasm of colon: Secondary | ICD-10-CM

## 2018-10-14 DIAGNOSIS — Z7982 Long term (current) use of aspirin: Secondary | ICD-10-CM | POA: Diagnosis not present

## 2018-10-14 DIAGNOSIS — I252 Old myocardial infarction: Secondary | ICD-10-CM | POA: Insufficient documentation

## 2018-10-14 DIAGNOSIS — Z79899 Other long term (current) drug therapy: Secondary | ICD-10-CM | POA: Diagnosis not present

## 2018-10-14 DIAGNOSIS — Z955 Presence of coronary angioplasty implant and graft: Secondary | ICD-10-CM | POA: Insufficient documentation

## 2018-10-14 DIAGNOSIS — Z882 Allergy status to sulfonamides status: Secondary | ICD-10-CM | POA: Diagnosis not present

## 2018-10-14 DIAGNOSIS — Z85820 Personal history of malignant melanoma of skin: Secondary | ICD-10-CM | POA: Diagnosis not present

## 2018-10-14 DIAGNOSIS — K5730 Diverticulosis of large intestine without perforation or abscess without bleeding: Secondary | ICD-10-CM | POA: Diagnosis not present

## 2018-10-14 DIAGNOSIS — Z888 Allergy status to other drugs, medicaments and biological substances status: Secondary | ICD-10-CM | POA: Insufficient documentation

## 2018-10-14 DIAGNOSIS — I10 Essential (primary) hypertension: Secondary | ICD-10-CM | POA: Insufficient documentation

## 2018-10-14 DIAGNOSIS — I2510 Atherosclerotic heart disease of native coronary artery without angina pectoris: Secondary | ICD-10-CM | POA: Insufficient documentation

## 2018-10-14 HISTORY — PX: POLYPECTOMY: SHX5525

## 2018-10-14 HISTORY — DX: Unspecified osteoarthritis, unspecified site: M19.90

## 2018-10-14 HISTORY — PX: COLONOSCOPY WITH PROPOFOL: SHX5780

## 2018-10-14 SURGERY — COLONOSCOPY WITH PROPOFOL
Anesthesia: General

## 2018-10-14 SURGERY — COLONOSCOPY WITH PROPOFOL
Anesthesia: General | Site: Rectum

## 2018-10-14 MED ORDER — STERILE WATER FOR IRRIGATION IR SOLN
Status: DC | PRN
  Administered 2018-10-14: 09:00:00

## 2018-10-14 MED ORDER — LIDOCAINE HCL (CARDIAC) PF 100 MG/5ML IV SOSY
PREFILLED_SYRINGE | INTRAVENOUS | Status: DC | PRN
  Administered 2018-10-14: 40 mg via INTRAVENOUS

## 2018-10-14 MED ORDER — SODIUM CHLORIDE 0.9 % IV SOLN: INTRAVENOUS | Status: DC

## 2018-10-14 MED ORDER — LACTATED RINGERS IV SOLN
INTRAVENOUS | Status: DC | PRN
  Administered 2018-10-14: 09:00:00 via INTRAVENOUS

## 2018-10-14 MED ORDER — PROPOFOL 10 MG/ML IV BOLUS
INTRAVENOUS | Status: DC | PRN
  Administered 2018-10-14: 20 mg via INTRAVENOUS
  Administered 2018-10-14: 100 mg via INTRAVENOUS
  Administered 2018-10-14 (×4): 20 mg via INTRAVENOUS

## 2018-10-14 MED ORDER — LACTATED RINGERS IV SOLN: INTRAVENOUS | Status: DC

## 2018-10-14 SURGICAL SUPPLY — 8 items
CANISTER SUCT 1200ML W/VALVE (MISCELLANEOUS) ×3 IMPLANT
COLOWRAP REGULAR (MISCELLANEOUS) ×3
COMPRESSION COLOWRAP REGULAR (MISCELLANEOUS) ×1 IMPLANT
FORCEPS BIOP RAD 4 LRG CAP 4 (CUTTING FORCEPS) ×3 IMPLANT
GOWN CVR UNV OPN BCK APRN NK (MISCELLANEOUS) ×2 IMPLANT
GOWN ISOL THUMB LOOP REG UNIV (MISCELLANEOUS) ×4
KIT ENDO PROCEDURE OLY (KITS) ×3 IMPLANT
WATER STERILE IRR 250ML POUR (IV SOLUTION) ×3 IMPLANT

## 2018-10-14 NOTE — Op Note (Signed)
[facility] Patient Name: Denise Jenkins Procedure Date: 10/14/2018 9:02 AM MRN: [id number] Account #: [id number] Date of Birth: [birth year] Admit Type: Outpatient Age: 68 Room: [facility] OR ROOM 01 Gender: Female Note Status: Finalized Procedure:            Colonoscopy Indications:          Screening for colorectal malignant neoplasm, Last                        colonoscopy: December 2011 Providers:            Lin Landsman MD, MD Referring MD:         Leona Carry. Hall Busing, MD (Referring MD) Medicines:            Monitored Anesthesia Care Complications:        No immediate complications. Estimated blood loss: None. Procedure:            Pre-Anesthesia Assessment:                       - Prior to the procedure, a History and Physical was                        performed, and patient medications and allergies were                        reviewed. The patient is competent. The risks and                        benefits of the procedure and the sedation options and                        risks were discussed with the patient. All questions                        were answered and informed consent was obtained.                        Patient identification and proposed procedure were                        verified by the physician, the nurse, the                        anesthesiologist, the anesthetist and the technician in                        the pre-procedure area in the procedure room in the                        endoscopy suite. Mental Status Examination: alert and                        oriented. Airway Examination: normal oropharyngeal                        airway and neck mobility. Respiratory Examination:                        clear to auscultation. CV Examination: normal.  Prophylactic Antibiotics: The patient does not require                        prophylactic antibiotics. Prior Anticoagulants: The                         patient has taken Plavix (clopidogrel), last dose was 5                        days prior to procedure. ASA Grade Assessment: III - A                        patient with severe systemic disease. After reviewing                        the risks and benefits, the patient was deemed in                        satisfactory condition to undergo the procedure. The                        anesthesia plan was to use monitored anesthesia care                        (MAC). Immediately prior to administration of                        medications, the patient was re-assessed for adequacy                        to receive sedatives. The heart rate, respiratory rate,                        oxygen saturations, blood pressure, adequacy of                        pulmonary ventilation, and response to care were                        monitored throughout the procedure. The physical status                        of the patient was re-assessed after the procedure.                       After obtaining informed consent, the colonoscope was                        passed under direct vision. Throughout the procedure,                        the patient's blood pressure, pulse, and oxygen                        saturations were monitored continuously. The was                        introduced through the anus and advanced to the the  cecum, identified by appendiceal orifice and ileocecal                        valve. The colonoscopy was performed without                        difficulty. The patient tolerated the procedure well.                        The quality of the bowel preparation was evaluated                        using the BBPS [facility], The Bowel Preparation Scale) with                        scores of: Right Colon = 3, Transverse Colon = 3 and                        Left Colon = 3 (entire mucosa seen well with no                        residual staining, small fragments of stool or  opaque                        liquid). The total BBPS score equals 9. Findings:      A 5 mm polyp was found in the cecum. The polyp was sessile. The polyp       was removed with a cold biopsy forceps. Resection and retrieval were       complete.      The exam was otherwise without abnormality.      The retroflexed view of the distal rectum and anal verge was normal and       showed no anal or rectal abnormalities.      Multiple diverticula were found in the sigmoid colon. Impression:           - One 5 mm polyp in the cecum, removed with a cold                        biopsy forceps. Resected and retrieved.                       - The examination was otherwise normal.                       - The distal rectum and anal verge are normal on                        retroflexion view. Recommendation:       - Discharge patient to home (with spouse).                       - Resume previous diet today.                       - Continue present medications.                       - Await pathology results.                       -  Repeat colonoscopy in 5-10 years for surveillance                        based on pathology results. Procedure Code(s):    --- Professional ---                       [id number], Colonoscopy, flexible; with biopsy, single or                        multiple Diagnosis Code(s):    --- Professional ---                       Z12.11, Encounter for screening for malignant neoplasm                        of colon                       D12.0, Benign neoplasm of cecum CPT copyright 2018 American Medical Association. All rights reserved. The codes documented in this report are preliminary and upon coder review may  be revised to meet current compliance requirements. Dr. Ulyess Mort Lin Landsman MD, MD 10/14/2018 9:37:04 AM This report has been signed electronically. Number of Addenda: 0 Note Initiated On: 10/14/2018 9:02 AM Scope Withdrawal Time: 0 hours 10 minutes 4 seconds   Total Procedure Duration: 0 hours 12 minutes 12 seconds       [facility]

## 2018-10-14 NOTE — Anesthesia Postprocedure Evaluation (Signed)
Anesthesia Post Note  Patient: Denise Jenkins  Procedure(s) Performed: COLONOSCOPY WITH BIOPSY (N/A Rectum) POLYPECTOMY (N/A Rectum)  Patient location during evaluation: PACU Anesthesia Type: General Level of consciousness: awake and alert Pain management: pain level controlled Vital Signs Assessment: post-procedure vital signs reviewed and stable Respiratory status: spontaneous breathing, nonlabored ventilation, respiratory function stable and patient connected to nasal cannula oxygen Cardiovascular status: blood pressure returned to baseline and stable Postop Assessment: no apparent nausea or vomiting Anesthetic complications: no    Tracia Lacomb

## 2018-10-14 NOTE — Transfer of Care (Signed)
Immediate Anesthesia Transfer of Care Note  Patient: Denise Jenkins  Procedure(s) Performed: COLONOSCOPY WITH BIOPSY (N/A Rectum) POLYPECTOMY (N/A Rectum)  Patient Location: PACU  Anesthesia Type: General  Level of Consciousness: awake, alert  and patient cooperative  Airway and Oxygen Therapy: Patient Spontanous Breathing and Patient connected to supplemental oxygen  Post-op Assessment: Post-op Vital signs reviewed, Patient's Cardiovascular Status Stable, Respiratory Function Stable, Patent Airway and No signs of Nausea or vomiting  Post-op Vital Signs: Reviewed and stable  Complications: No apparent anesthesia complications

## 2018-10-14 NOTE — Anesthesia Procedure Notes (Signed)
Procedure Name: MAC Date/Time: 10/14/2018 9:17 AM Performed by: Janna Arch, CRNA Pre-anesthesia Checklist: Patient identified, Emergency Drugs available, Suction available, Timeout performed and Patient being monitored Patient Re-evaluated:Patient Re-evaluated prior to induction Oxygen Delivery Method: Nasal cannula Placement Confirmation: positive ETCO2

## 2018-10-14 NOTE — H&P (Signed)
Cephas Darby, MD [address]  [place]  [place], [place] [address]  Main: [phone]  Fax: [phone] Pager: [phone]  Primary Care Physician:  Albina Billet, MD Primary Gastroenterologist:  Dr. Cephas Darby  Pre-Procedure History & Physical: HPI:  Denise Jenkins is a 68 y.o. female is here for an colonoscopy.   Past Medical History:  Diagnosis Date  . Arthritis    right knee  . Heart attack ([place]) 2004  . Hyperlipidemia   . Hypertension 2002  . Kidney stones   . Melanoma ([place])   . Myocardial infarction ([place]) 2004  . Other symptoms involving digestive system(787.99)     Past Surgical History:  Procedure Laterality Date  . CARDIAC CATHETERIZATION Left 07/31/2016   Procedure: Left Heart Cath and Coronary Angiography;  Surgeon: Isaias Cowman, MD;  Location: [place] [facility];  Service: Cardiovascular;  Laterality: Left;  . CARDIAC CATHETERIZATION N/A 07/31/2016   Procedure: Coronary Stent Intervention;  Surgeon: Isaias Cowman, MD;  Location: [place] [facility];  Service: Cardiovascular;  Laterality: N/A;  . COLONOSCOPY  2006  . LITHOTRIPSY  2011  . stent placed in heart   2004   non-Q-wave infarction  . UPPER GI ENDOSCOPY  2006    Prior to Admission medications   Medication Sig Start Date End Date Taking? Authorizing Provider  aspirin 81 MG tablet Take 81 mg by mouth daily.   Yes [provider]  atenolol (TENORMIN) 25 MG tablet Take 25 mg by mouth daily.   Yes [provider]  atorvastatin (LIPITOR) 40 MG tablet Take 40 mg by mouth daily.   Yes [provider]  cholecalciferol (VITAMIN D) 400 units TABS tablet Take 800 Units by mouth daily.   Yes [provider]  clopidogrel (PLAVIX) 75 MG tablet Take 1 tablet (75 mg total) by mouth daily with breakfast. 08/01/16  Yes Paraschos, Alexander, MD  Coenzyme Q10 (CO Q-10) 100 MG CAPS Take by mouth 2 (two) times daily.    Yes [provider]    desonide (DESOWEN) 0.05 % cream Apply 1 application topically 2 (two) times daily as needed.   Yes [provider]  ezetimibe (ZETIA) 10 MG tablet Take 10 mg by mouth daily.  07/31/13  Yes [provider]  meloxicam (MOBIC) 15 MG tablet Take 15 mg by mouth daily.   Yes [provider]  mometasone (ELOCON) 0.1 % ointment Apply topically daily as needed. Ear drops   Yes [provider]  Multiple Vitamin (MULTI-VITAMINS) TABS Take 1 tablet by mouth daily.    Yes [provider]  nitroGLYCERIN (NITROSTAT) 0.4 MG SL tablet Place 0.4 mg under the tongue every 5 (five) minutes as needed for chest pain.  09/30/13  Yes [provider]  polyethylene glycol powder (GLYCOLAX/MIRALAX) powder 255 grams one bottle for colonoscopy prep Patient not taking: Reported on 10/06/2018 08/13/18   Robert Bellow, MD    Allergies as of 09/23/2018 - Review Complete 08/13/2018  Allergen Reaction Noted  . Niacin and related Itching 07/24/2016  . Oxycodone Other (See Comments) 10/19/2014  . Sulfa antibiotics Itching 03/18/2013    Family History  Problem Relation Age of Onset  . Colon polyps Mother   . Colon polyps Maternal Uncle   . Breast cancer Neg Hx     Social History   Socioeconomic History  . Marital status: Married    Spouse name: Not on file  . Number of children: Not on file  .  Years of education: Not on file  . Highest education level: Not on file  Occupational History  . Not on file  Social Needs  . Financial resource strain: Not on file  . Food insecurity:    Worry: Not on file    Inability: Not on file  . Transportation needs:    Medical: Not on file    Non-medical: Not on file  Tobacco Use  . Smoking status: Never Smoker  . Smokeless tobacco: Never Used  Substance and Sexual Activity  . Alcohol use: No    Alcohol/week: 0.0 standard drinks    Comment: Rare - couple x/yr  . Drug use: No  . Sexual activity: Not on file   Lifestyle  . Physical activity:    Days per week: Not on file    Minutes per session: Not on file  . Stress: Not on file  Relationships  . Social connections:    Talks on phone: Not on file    Gets together: Not on file    Attends religious service: Not on file    Active member of club or organization: Not on file    Attends meetings of clubs or organizations: Not on file    Relationship status: Not on file  . Intimate partner violence:    Fear of current or ex partner: Not on file    Emotionally abused: Not on file    Physically abused: Not on file    Forced sexual activity: Not on file  Other Topics Concern  . Not on file  Social History Narrative  . Not on file    Review of Systems: See HPI, otherwise negative ROS  Physical Exam: BP 138/89   Pulse (!) 54   Temp 98.2 F (36.8 C) (Temporal)   Ht 5\' 1"  (1.549 m)   Wt 97.1 kg   SpO2 97%   BMI 40.43 kg/m  General:   Alert,  pleasant and cooperative in NAD Head:  Normocephalic and atraumatic. Neck:  Supple; no masses or thyromegaly. Lungs:  Clear throughout to auscultation.    Heart:  Regular rate and rhythm. Abdomen:  Soft, nontender and nondistended. Normal bowel sounds, without guarding, and without rebound.   Neurologic:  Alert and  oriented x4;  grossly normal neurologically.  Impression/Plan: Denise Jenkins is here for an colonoscopy to be performed for colon cancer screening  Risks, benefits, limitations, and alternatives regarding  colonoscopy have been reviewed with the patient.  Questions have been answered.  All parties agreeable.   Sherri Sear, MD  10/14/2018, 8:32 AM

## 2018-10-15 ENCOUNTER — Encounter: Payer: Self-pay | Admitting: Gastroenterology

## 2018-10-19 ENCOUNTER — Encounter: Payer: Self-pay | Admitting: Gastroenterology

## 2018-10-20 ENCOUNTER — Encounter: Payer: Self-pay | Admitting: General Surgery

## 2019-04-21 DIAGNOSIS — M722 Plantar fascial fibromatosis: Secondary | ICD-10-CM | POA: Insufficient documentation

## 2019-06-18 ENCOUNTER — Other Ambulatory Visit: Payer: Self-pay

## 2019-06-18 DIAGNOSIS — C439 Malignant melanoma of skin, unspecified: Secondary | ICD-10-CM | POA: Insufficient documentation

## 2019-06-21 ENCOUNTER — Other Ambulatory Visit: Payer: Self-pay

## 2019-06-21 ENCOUNTER — Ambulatory Visit (INDEPENDENT_AMBULATORY_CARE_PROVIDER_SITE_OTHER): Payer: Medicare Other | Admitting: Urology

## 2019-06-21 ENCOUNTER — Encounter: Payer: Self-pay | Admitting: Urology

## 2019-06-21 VITALS — BP 123/77 | HR 58 | Ht 61.0 in | Wt 216.0 lb

## 2019-06-21 DIAGNOSIS — N200 Calculus of kidney: Secondary | ICD-10-CM | POA: Diagnosis not present

## 2019-06-22 ENCOUNTER — Encounter: Payer: Self-pay | Admitting: Urology

## 2019-06-22 LAB — MICROSCOPIC EXAMINATION
Bacteria, UA: NONE SEEN
RBC, Urine: NONE SEEN /hpf (ref 0–2)

## 2019-06-22 LAB — URINALYSIS, COMPLETE
Bilirubin, UA: NEGATIVE
Glucose, UA: NEGATIVE
Ketones, UA: NEGATIVE
Nitrite, UA: NEGATIVE
Protein,UA: NEGATIVE
RBC, UA: NEGATIVE
Specific Gravity, UA: <1.005 — ABNORMAL LOW (ref 1.005–1.030)
Urobilinogen, Ur: 0.2 mg/dL (ref 0.2–1.0)
pH, UA: 6 (ref 5.0–7.5)

## 2019-06-22 NOTE — Progress Notes (Signed)
06/21/2019 11:07 AM   Denise Jenkins 27-Apr-1951 [id number]  Referring provider: Albina Billet, MD [address]   [place],  [place] [address]  Chief Complaint  Patient presents with  . Nephrolithiasis    HPI: Denise Jenkins is a 68 y.o. female with a history of recurrent stone disease.  I last saw her at [facility] and September 2017.  She underwent shockwave lithotripsy July 2015 for a 7 mm right lower pole calculus.  Her initial CT showed a 2 mm nonobstructing left renal calculus which was not visualized on KUB.  She states she has done well however last month had "twinges" of right flank pain.  She has no voiding complaints.  Denies dysuria or gross hematuria.   PMH: Past Medical History:  Diagnosis Date  . Arthritis    right knee  . Heart attack ([place]) 2004  . Hyperlipidemia   . Hypertension 2002  . Kidney stones   . Melanoma ([place])   . Myocardial infarction ([place]) 2004  . Other symptoms involving digestive system(787.99)     Surgical History: Past Surgical History:  Procedure Laterality Date  . CARDIAC CATHETERIZATION Left 07/31/2016   Procedure: Left Heart Cath and Coronary Angiography;  Surgeon: Isaias Cowman, MD;  Location: [place] [facility];  Service: Cardiovascular;  Laterality: Left;  . CARDIAC CATHETERIZATION N/A 07/31/2016   Procedure: Coronary Stent Intervention;  Surgeon: Isaias Cowman, MD;  Location: [place];  Service: Cardiovascular;  Laterality: N/A;  . COLONOSCOPY  2006  . COLONOSCOPY WITH PROPOFOL N/A 10/14/2018   Procedure: COLONOSCOPY WITH BIOPSY;  Surgeon: Lin Landsman, MD;  Location: [place];  Service: Endoscopy;  Laterality: N/A;  . LITHOTRIPSY  2011  . POLYPECTOMY N/A 10/14/2018   Procedure: POLYPECTOMY;  Surgeon: Lin Landsman, MD;  Location: [place];  Service: Endoscopy;  Laterality: N/A;  . stent placed in heart   2004   non-Q-wave infarction  . UPPER GI ENDOSCOPY  2006    Home  Medications:  Allergies as of 06/21/2019      Reactions   Niacin And Related Itching   Oxycodone Other (See Comments)   Felt "drunk"   Adhesive [tape] Rash   Sulfa Antibiotics Itching      Medication List       Accurate as of June 21, 2019 11:59 PM. If you have any questions, ask your nurse or doctor.        aspirin 81 MG tablet Take 81 mg by mouth daily.   atenolol 25 MG tablet Commonly known as: TENORMIN Take 25 mg by mouth daily.   atorvastatin 40 MG tablet Commonly known as: LIPITOR Take 40 mg by mouth daily.   cholecalciferol 10 MCG (400 UNIT) Tabs tablet Commonly known as: VITAMIN D3 Take 800 Units by mouth daily.   clobetasol cream 0.05 % Commonly known as: TEMOVATE Apply topically.   clopidogrel 75 MG tablet Commonly known as: PLAVIX Take 1 tablet (75 mg total) by mouth daily with breakfast.   Co Q-10 100 MG Caps Take by mouth 2 (two) times daily.   desonide 0.05 % cream Commonly known as: DESOWEN Apply 1 application topically 2 (two) times daily as needed.   ezetimibe 10 MG tablet Commonly known as: ZETIA Take 10 mg by mouth daily.   fluocinonide cream 0.05 % Commonly known as: LIDEX APPLY TOPICALLY TWO (2) TIMES A DAY. TO RASH UNTIL RESOLVED   meloxicam 15 MG tablet Commonly known as: MOBIC Take  15 mg by mouth daily.   mometasone 0.1 % ointment Commonly known as: ELOCON Apply topically daily as needed. Ear drops   Multi-Vitamins Tabs Take 1 tablet by mouth daily.   nitroGLYCERIN 0.4 MG SL tablet Commonly known as: NITROSTAT Place 0.4 mg under the tongue every 5 (five) minutes as needed for chest pain.       Allergies:  Allergies  Allergen Reactions  . Niacin And Related Itching  . Oxycodone Other (See Comments)    Felt "drunk"  . Adhesive [Tape] Rash  . Sulfa Antibiotics Itching    Family History: Family History  Problem Relation Age of Onset  . Colon polyps Mother   . Colon polyps Maternal Uncle   . Breast cancer  Neg Hx     Social History:  reports that she has never smoked. She has never used smokeless tobacco. She reports that she does not drink alcohol or use drugs.  ROS: UROLOGY Frequent Urination?: No Hard to postpone urination?: No Burning/pain with urination?: No Get up at night to urinate?: No Leakage of urine?: No Urine stream starts and stops?: No Trouble starting stream?: No Do you have to strain to urinate?: No Blood in urine?: No Urinary tract infection?: No Sexually transmitted disease?: No Injury to kidneys or bladder?: No Painful intercourse?: No Weak stream?: No Currently pregnant?: No Vaginal bleeding?: No Last menstrual period?: N/A  Gastrointestinal Nausea?: No Vomiting?: No Indigestion/heartburn?: No Diarrhea?: No Constipation?: No  Constitutional Fever: No Night sweats?: No Weight loss?: No Fatigue?: No  Skin Skin rash/lesions?: No Itching?: No  Eyes Blurred vision?: No Double vision?: No  Ears/Nose/Throat Sore throat?: No Sinus problems?: No  Hematologic/Lymphatic Swollen glands?: No Easy bruising?: No  Cardiovascular Leg swelling?: No Chest pain?: No  Respiratory Cough?: No Shortness of breath?: No  Endocrine Excessive thirst?: No  Musculoskeletal Back pain?: No Joint pain?: No  Neurological Headaches?: No Dizziness?: No  Psychologic Depression?: No Anxiety?: No  Physical Exam: BP 123/77 (BP Location: Left Arm, Patient Position: Sitting, Cuff Size: Normal)   Pulse (!) 58   Ht 5\' 1"  (1.549 m)   Wt 216 lb (98 kg)   BMI 40.81 kg/m   Constitutional:  Alert and oriented, No acute distress. HEENT: [place] AT, moist mucus membranes.  Trachea midline, no masses. Cardiovascular: No clubbing, cyanosis, or edema. Respiratory: Normal respiratory effort, no increased work of breathing. GI: Abdomen is soft, nontender, nondistended, no abdominal masses GU: No CVA tenderness Lymph: No cervical or inguinal lymphadenopathy. Skin: No  rashes, bruises or suspicious lesions. Neurologic: Grossly intact, no focal deficits, moving all 4 extremities. Psychiatric: Normal mood and affect.   Assessment & Plan:    - Nephrolithiasis Prior CT 2015 showed a 2 mm nonobstructing left renal calculus which was not seen on serial KUB.  Her last imaging was in 2017.  She has had minimal right flank pain.  I recommended scheduling a renal ultrasound and KUB.  She will be notified with results and further recommendations.  Abbie Sons, [place] [address], [place], [place] [address] [phone]

## 2019-06-23 ENCOUNTER — Telehealth: Payer: Self-pay

## 2019-06-23 NOTE — Telephone Encounter (Signed)
Called pt informed her of the information below. Pt gave verbal understanding.  

## 2019-06-23 NOTE — Telephone Encounter (Signed)
-----   Message from Abbie Sons, MD sent at 06/23/2019  7:11 AM EDT ----- Urinalysis was normal

## 2019-07-13 ENCOUNTER — Ambulatory Visit
Admission: RE | Admit: 2019-07-13 | Discharge: 2019-07-13 | Disposition: A | Discharge status: Home / Self Care | Payer: Medicare Other | Source: Ambulatory Visit | Attending: Urology | Admitting: Urology

## 2019-07-13 ENCOUNTER — Other Ambulatory Visit: Payer: Self-pay

## 2019-07-13 DIAGNOSIS — N200 Calculus of kidney: Secondary | ICD-10-CM

## 2019-07-14 ENCOUNTER — Telehealth: Payer: Self-pay

## 2019-07-14 NOTE — Telephone Encounter (Signed)
-----   Message from Abbie Sons, MD sent at 07/13/2019  4:31 PM EST ----- Renal ultrasound showed no stones or other abnormalities.  I had also recommended a KUB which does not look like it was done

## 2019-07-14 NOTE — Telephone Encounter (Signed)
Called pt's home number, spouse answers who states that pt is currently at a doctor's appt, will call again later. 1st attempt.

## 2019-07-16 NOTE — Telephone Encounter (Signed)
Pt declines KUB or f/u at this time.

## 2019-07-16 NOTE — Telephone Encounter (Signed)
Called pt informed her of the information below. Pt gave verbal understanding.  

## 2019-08-05 ENCOUNTER — Other Ambulatory Visit: Payer: Self-pay | Admitting: Internal Medicine

## 2019-08-05 DIAGNOSIS — Z1231 Encounter for screening mammogram for malignant neoplasm of breast: Secondary | ICD-10-CM

## 2019-09-23 ENCOUNTER — Ambulatory Visit: Payer: Medicare Other | Attending: Internal Medicine

## 2019-09-23 ENCOUNTER — Ambulatory Visit: Payer: Self-pay

## 2019-09-23 DIAGNOSIS — Z23 Encounter for immunization: Secondary | ICD-10-CM | POA: Insufficient documentation

## 2019-09-23 NOTE — Progress Notes (Signed)
   Covid-19 Vaccination Clinic  Name:  Denise Jenkins    MRN: [id number] DOB: 1950/12/02  09/23/2019  [name] was observed post Covid-19 immunization for 30 minutes based on pre-vaccination screening without incidence. She was provided with Vaccine Information Sheet and instruction to access the V-Safe system.   [name] was instructed to call 911 with any severe reactions post vaccine: Marland Kitchen Difficulty breathing  . Swelling of your face and throat  . A fast heartbeat  . A bad rash all over your body  . Dizziness and weakness    Immunizations Administered    Name Date Dose VIS Date Route   Pfizer COVID-19 Vaccine 09/23/2019  1:20 PM 0.3 mL 08/13/2019 Intramuscular   Manufacturer: [place]   Lot: [id number]   [place]: [id number]

## 2019-09-29 ENCOUNTER — Ambulatory Visit: Payer: Medicare Other

## 2019-10-13 ENCOUNTER — Other Ambulatory Visit: Payer: Self-pay | Admitting: Internal Medicine

## 2019-10-13 ENCOUNTER — Other Ambulatory Visit: Payer: Self-pay

## 2019-10-13 ENCOUNTER — Ambulatory Visit
Admission: RE | Admit: 2019-10-13 | Discharge: 2019-10-13 | Disposition: A | Discharge status: Home / Self Care | Payer: Medicare Other | Source: Ambulatory Visit | Attending: Internal Medicine | Admitting: Internal Medicine

## 2019-10-13 DIAGNOSIS — N6310 Unspecified lump in the right breast, unspecified quadrant: Secondary | ICD-10-CM

## 2019-10-13 DIAGNOSIS — Z1231 Encounter for screening mammogram for malignant neoplasm of breast: Secondary | ICD-10-CM

## 2019-10-14 ENCOUNTER — Ambulatory Visit: Payer: Medicare Other | Attending: Internal Medicine

## 2019-10-14 DIAGNOSIS — Z23 Encounter for immunization: Secondary | ICD-10-CM

## 2019-10-14 NOTE — Progress Notes (Signed)
   Covid-19 Vaccination Clinic  Name:  Denise Jenkins    MRN: [id number] DOB: [birth year]  10/14/2019  Denise Jenkins was observed post Covid-19 immunization for 15 minutes without incidence. She was provided with Vaccine Information Sheet and instruction to access the V-Safe system.   Denise Jenkins was instructed to call 911 with any severe reactions post vaccine: Marland Kitchen Difficulty breathing  . Swelling of your face and throat  . A fast heartbeat  . A bad rash all over your body  . Dizziness and weakness    Immunizations Administered    Name Date Dose VIS Date Route   Pfizer COVID-19 Vaccine 10/14/2019  1:41 PM 0.3 mL 08/13/2019 Intramuscular   Manufacturer: [place]   Lot: [id number]   [place]: [id number]

## 2019-10-18 ENCOUNTER — Ambulatory Visit
Admission: RE | Admit: 2019-10-18 | Discharge: 2019-10-18 | Disposition: A | Discharge status: Home / Self Care | Payer: Medicare Other | Source: Ambulatory Visit | Attending: Internal Medicine | Admitting: Internal Medicine

## 2019-10-18 DIAGNOSIS — N6310 Unspecified lump in the right breast, unspecified quadrant: Secondary | ICD-10-CM

## 2019-10-18 DIAGNOSIS — N6315 Unspecified lump in the right breast, overlapping quadrants: Secondary | ICD-10-CM | POA: Insufficient documentation

## 2019-12-22 IMAGING — MG DIGITAL SCREENING BILATERAL MAMMOGRAM WITH TOMO AND CAD
6 of 12 series · 6 of 36 positions shown · non-contrast
Comparison: Previous exam(s).

ACR Breast Density Category a: The breast tissue is almost entirely
fatty.

CLINICAL DATA: Screening.

EXAM:
DIGITAL SCREENING BILATERAL MAMMOGRAM WITH TOMO AND CAD

[R CC synth-2D (1 of 2)]
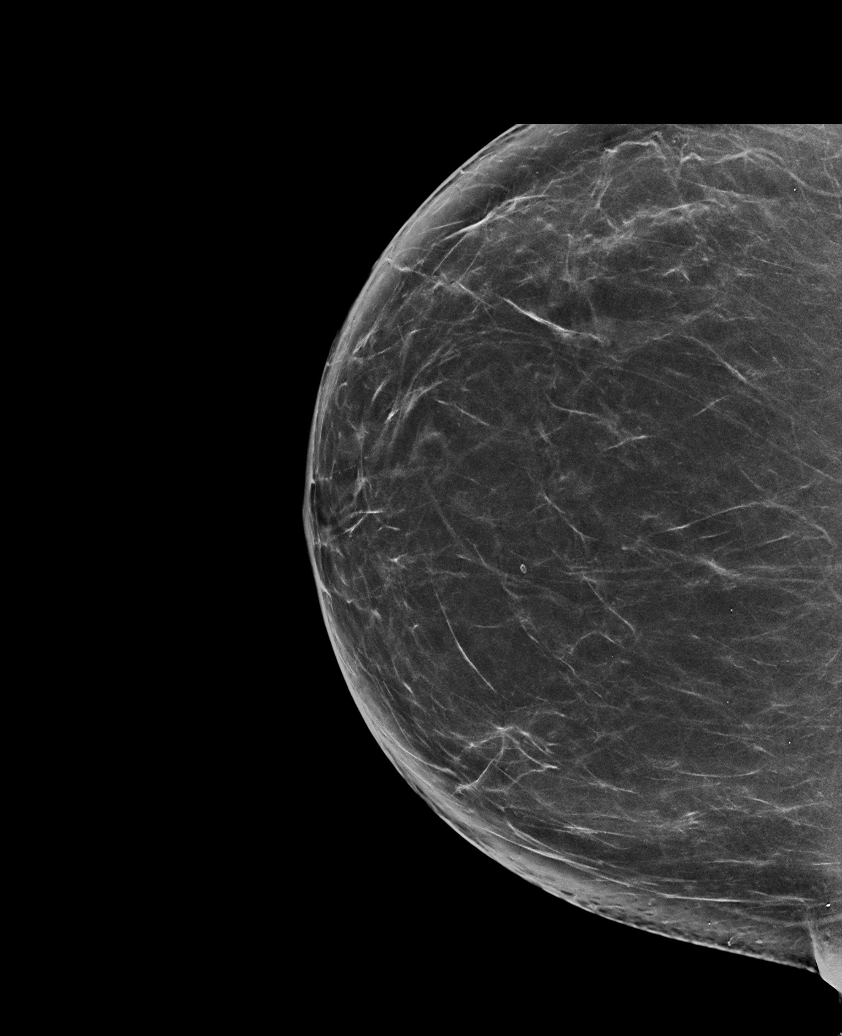

[R MLO synth-2D]
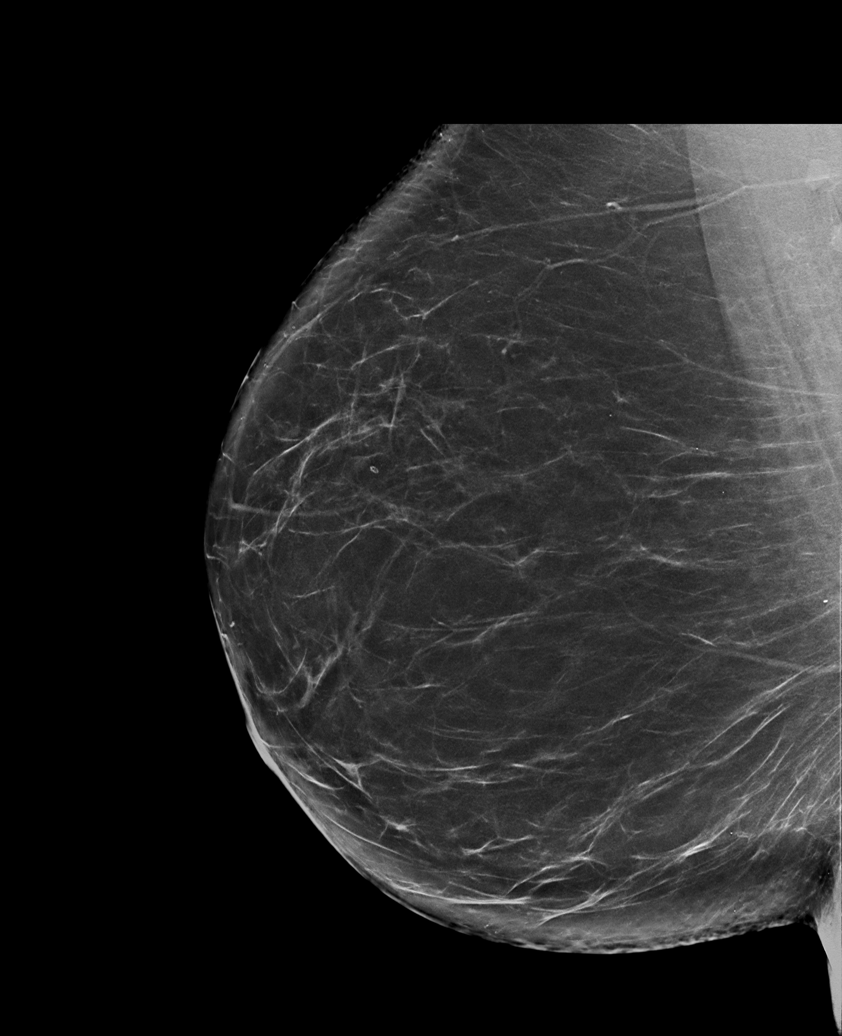

[R CC synth-2D (2 of 2)]
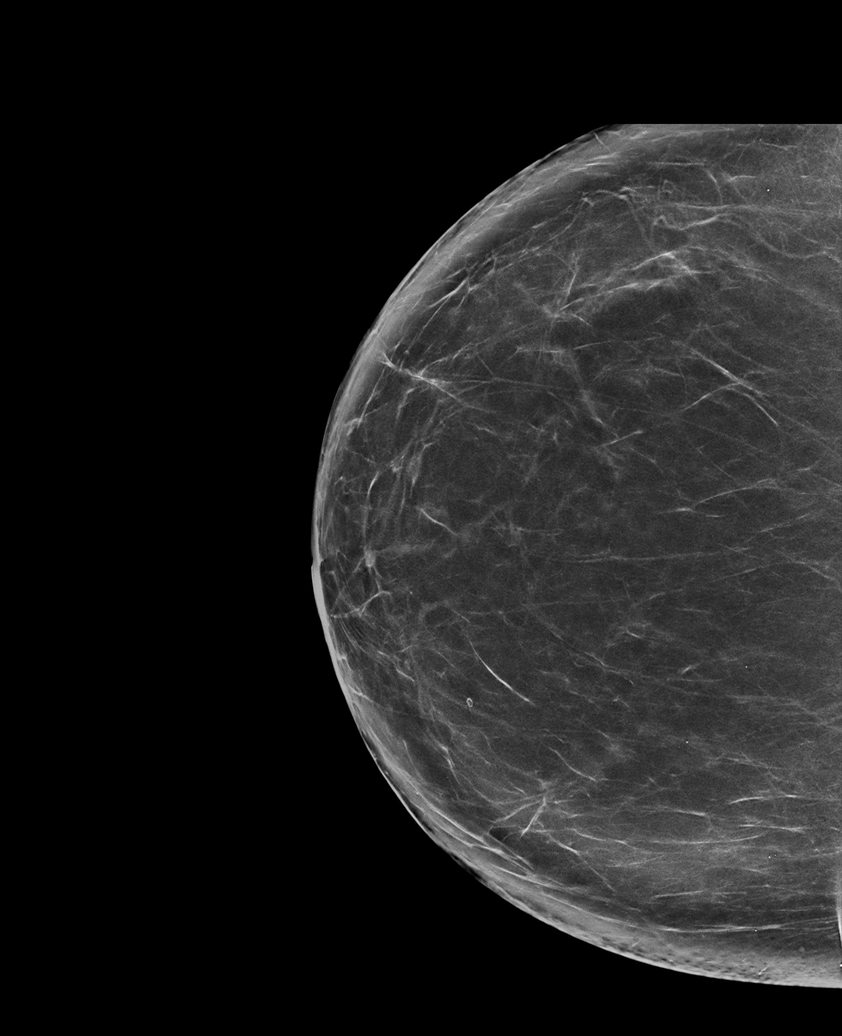

[L MLO synth-2D]
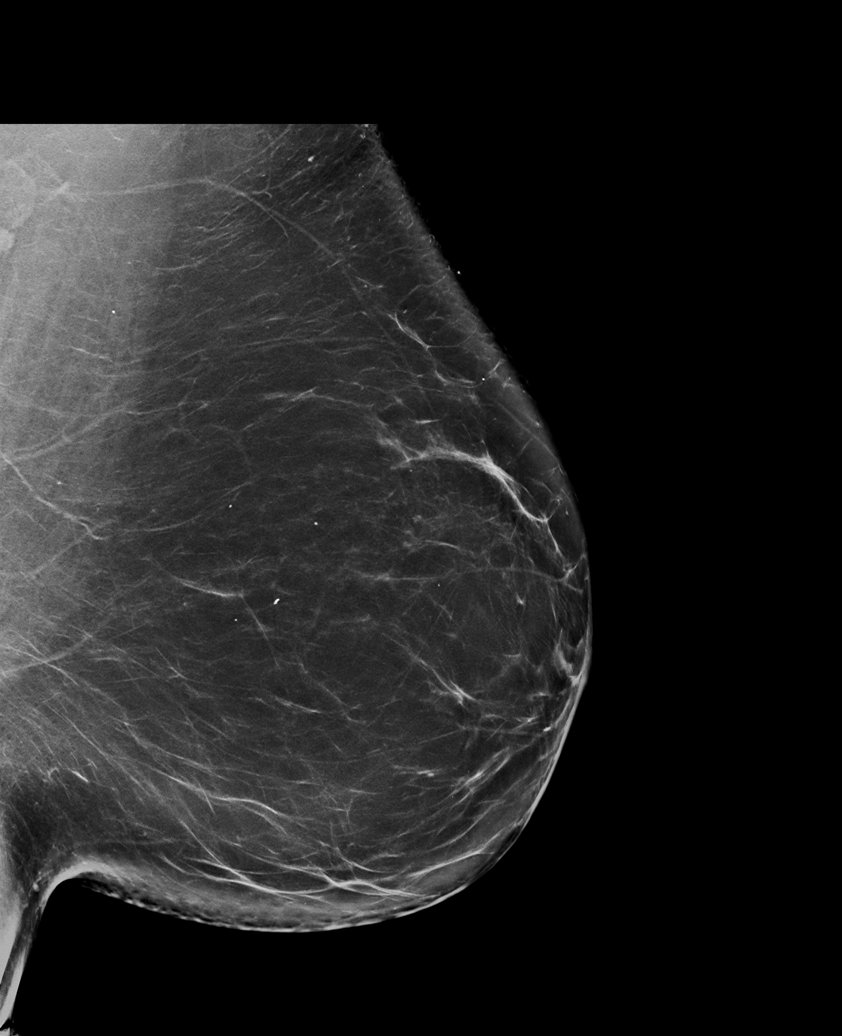

[L CC synth-2D (1 of 2)]
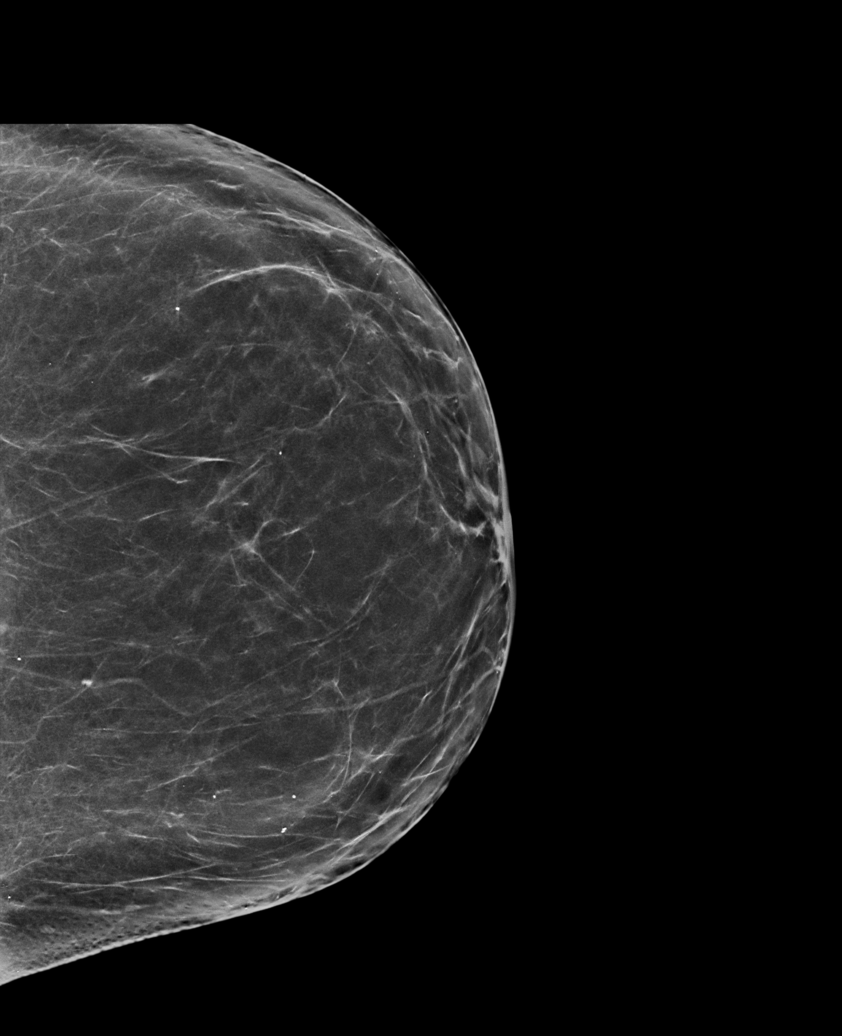

[L CC synth-2D (2 of 2)]
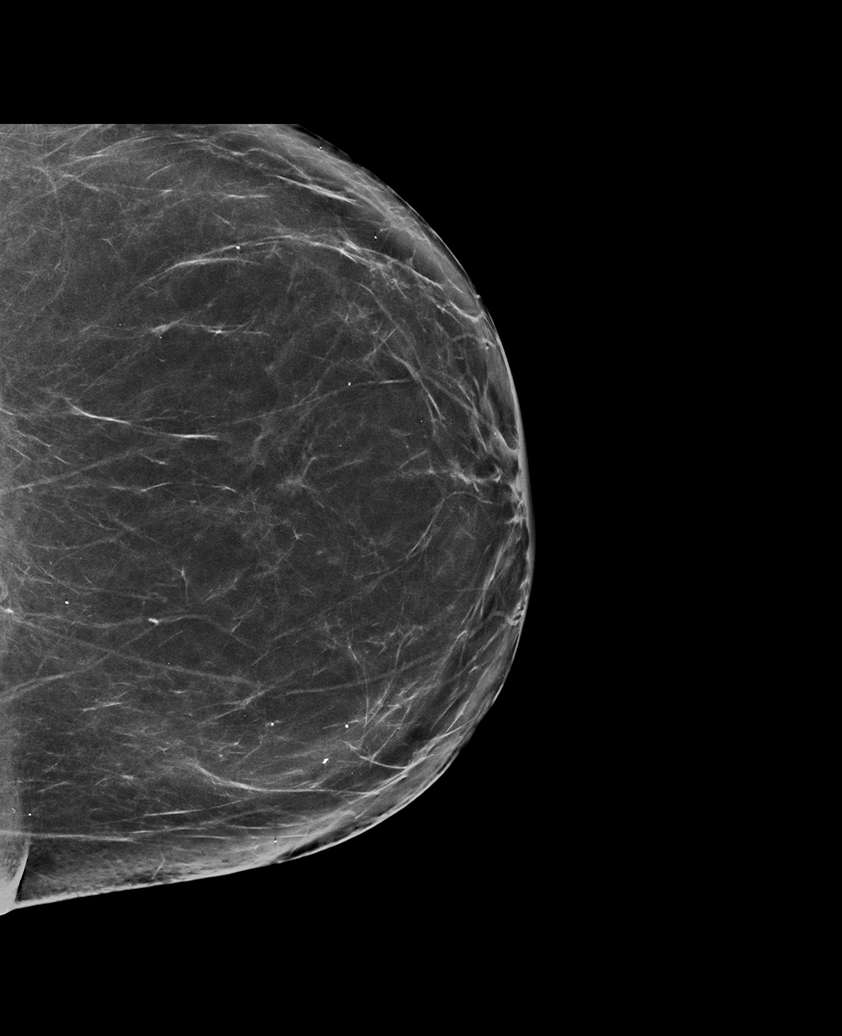

[6 of 36 positions shown; findings below may reference images not displayed]

FINDINGS: There are no findings suspicious for malignancy. Images were
processed with CAD.
IMPRESSION: No mammographic evidence of malignancy. A result letter of this
screening mammogram will be mailed directly to the patient.

RECOMMENDATION:
Screening mammogram in one year. (Code:[id number])

BI-RADS CATEGORY  1: Negative.

## 2021-09-06 ENCOUNTER — Other Ambulatory Visit: Payer: Self-pay | Admitting: Internal Medicine

## 2021-09-07 ENCOUNTER — Other Ambulatory Visit: Payer: Self-pay | Admitting: Internal Medicine

## 2021-09-07 DIAGNOSIS — Z1231 Encounter for screening mammogram for malignant neoplasm of breast: Secondary | ICD-10-CM

## 2021-11-12 ENCOUNTER — Other Ambulatory Visit: Payer: Self-pay

## 2021-11-12 ENCOUNTER — Ambulatory Visit
Admission: RE | Admit: 2021-11-12 | Discharge: 2021-11-12 | Disposition: A | Discharge status: Home / Self Care | Payer: Medicare Other | Source: Ambulatory Visit | Attending: Internal Medicine | Admitting: Internal Medicine

## 2021-11-12 DIAGNOSIS — Z1231 Encounter for screening mammogram for malignant neoplasm of breast: Secondary | ICD-10-CM | POA: Diagnosis present

## 2022-09-30 ENCOUNTER — Other Ambulatory Visit: Payer: Self-pay | Admitting: Internal Medicine

## 2022-09-30 DIAGNOSIS — Z1231 Encounter for screening mammogram for malignant neoplasm of breast: Secondary | ICD-10-CM

## 2022-11-14 ENCOUNTER — Ambulatory Visit
Admission: RE | Admit: 2022-11-14 | Discharge: 2022-11-14 | Disposition: A | Discharge status: Home / Self Care | Payer: Medicare Other | Source: Ambulatory Visit | Attending: Internal Medicine | Admitting: Internal Medicine

## 2022-11-14 DIAGNOSIS — Z1231 Encounter for screening mammogram for malignant neoplasm of breast: Secondary | ICD-10-CM | POA: Insufficient documentation

## 2023-10-15 ENCOUNTER — Other Ambulatory Visit: Payer: Self-pay | Admitting: Internal Medicine

## 2023-10-15 DIAGNOSIS — Z1231 Encounter for screening mammogram for malignant neoplasm of breast: Secondary | ICD-10-CM

## 2023-11-07 ENCOUNTER — Ambulatory Visit: Payer: Medicare Other | Admitting: Internal Medicine

## 2023-11-10 ENCOUNTER — Encounter: Payer: Self-pay | Admitting: Internal Medicine

## 2023-11-10 ENCOUNTER — Ambulatory Visit: Payer: Medicare Other | Attending: Internal Medicine | Admitting: Internal Medicine

## 2023-11-10 ENCOUNTER — Other Ambulatory Visit (HOSPITAL_COMMUNITY): Payer: Self-pay

## 2023-11-10 ENCOUNTER — Telehealth: Payer: Self-pay | Admitting: Pharmacy Technician

## 2023-11-10 VITALS — BP 118/80 | HR 62 | Ht 61.0 in | Wt 221.8 lb

## 2023-11-10 DIAGNOSIS — E785 Hyperlipidemia, unspecified: Secondary | ICD-10-CM

## 2023-11-10 DIAGNOSIS — E7841 Elevated Lipoprotein(a): Secondary | ICD-10-CM | POA: Diagnosis not present

## 2023-11-10 DIAGNOSIS — I10 Essential (primary) hypertension: Secondary | ICD-10-CM | POA: Diagnosis not present

## 2023-11-10 DIAGNOSIS — Z955 Presence of coronary angioplasty implant and graft: Secondary | ICD-10-CM

## 2023-11-10 DIAGNOSIS — I2510 Atherosclerotic heart disease of native coronary artery without angina pectoris: Secondary | ICD-10-CM | POA: Diagnosis not present

## 2023-11-10 MED ORDER — REPATHA SURECLICK 140 MG/ML ~~LOC~~ SOAJ: 140.0000 mg | SUBCUTANEOUS | 11 refills | Status: AC

## 2023-11-10 NOTE — Telephone Encounter (Signed)
-----   Message from Nurse Eileen Stanford E sent at 11/10/2023 12:26 PM EDT ----- Regarding: repatha PA Hey team!  This patient needs a PA for Repatha  Dx: CAD, dyslipidemia LDL goal less than 55, elevated LPa  May need [facility] too!  TY!

## 2023-11-10 NOTE — Telephone Encounter (Signed)
 Pharmacy Patient Advocate Encounter   Received notification from Physician's Office that prior authorization for Repatha is required/requested.   Insurance verification completed.   The patient is insured through [facility] .   Per test claim: The current 0310/25 day co-pay is, $45.00- one month.  No PA needed at this time. This test claim was processed through [facility]- copay amounts may vary at other pharmacies due to pharmacy/plan contracts, or as the patient moves through the different stages of their insurance plan.

## 2023-11-10 NOTE — Progress Notes (Signed)
 OFFICE NOTE  Chief Complaint:  Establish cardiologist  Primary Care Physician: Jaclyn Shaggy, MD  HPI:  Denise Jenkins is a 73 y.o. female with a past medial history significant for coronary artery disease with NSTEMI on November 08, 2002.  She has been followed by Dr. Jamse Mead. She underwent Cypher stenting to the mid RCA and mid circumflex.  Other medical problems include hypertension, dyslipidemia and elevated LP(a).  Subsequently she had an abnormal Myoview stress test in 2017 and had overlapping resolute stents placed to the mid LAD.  Since then she has done well without any further chest pain or worsening shortness of breath.  Her last echo in December 2021 showed LVEF 50 to 55% with regards to dyslipidemia, she is also been followed by Dr. Lavonia Drafts in [place].  She had a recent lipid NMR on February 11 which showed an LDL particle number of 861, LDL of 72, HDL 65 and triglycerides 118.  Her small LDL particle number was minimally elevated at 539 with predominantly higher small particle number.  She has previously had an LP(a) which was elevated 144.6 nmol/L.  She has been trying to work on weight loss but has some limitations regards to her knees instability to exercise.  She is a former Human resources officer at [employer].  PMHx:  Past Medical History:  Diagnosis Date   Arthritis    right knee   Heart attack (HCC) 2004   Hyperlipidemia    Hypertension 2002   Kidney stones    Melanoma (HCC)    Myocardial infarction (HCC) 2004   Other symptoms involving digestive system(787.99)     Past Surgical History:  Procedure Laterality Date   CARDIAC CATHETERIZATION Left 07/31/2016   Procedure: Left Heart Cath and Coronary Angiography;  Surgeon: Marcina Millard, MD;  Location: [facility] INVASIVE CV LAB;  Service: Cardiovascular;  Laterality: Left;   CARDIAC CATHETERIZATION N/A 07/31/2016   Procedure: Coronary Stent Intervention;  Surgeon: Marcina Millard, MD;  Location: [facility] INVASIVE CV LAB;   Service: Cardiovascular;  Laterality: N/A;   COLONOSCOPY  2006   COLONOSCOPY WITH PROPOFOL N/A 10/14/2018   Procedure: COLONOSCOPY WITH BIOPSY;  Surgeon: Toney Reil, MD;  Location: [facility];  Service: Endoscopy;  Laterality: N/A;   LITHOTRIPSY  2011   POLYPECTOMY N/A 10/14/2018   Procedure: POLYPECTOMY;  Surgeon: Toney Reil, MD;  Location: [facility];  Service: Endoscopy;  Laterality: N/A;   stent placed in heart   2004   non-Q-wave infarction   UPPER GI ENDOSCOPY  2006    FAMHx:  Family History  Problem Relation Age of Onset   Colon polyps Mother    Colon polyps Maternal Uncle     SOCHx:   reports that she has never smoked. She has never used smokeless tobacco. She reports that she does not drink alcohol and does not use drugs.  ALLERGIES:  Allergies  Allergen Reactions   Latex Hives   Niacin Other (See Comments)   Niacin And Related Itching   Oxycodone Other (See Comments)    Felt "drunk"   Adhesive [Tape] Rash   Sulfa Antibiotics Itching    ROS: Pertinent items noted in HPI and remainder of comprehensive ROS otherwise negative.  HOME MEDS: Current Outpatient Medications on File Prior to Visit  Medication Sig Dispense Refill   aspirin 81 MG tablet Take 81 mg by mouth daily.     atenolol (TENORMIN) 25 MG tablet Take 25 mg by mouth daily.  atorvastatin (LIPITOR) 40 MG tablet Take 40 mg by mouth daily.     cholecalciferol (VITAMIN D) 400 units TABS tablet Take 800 Units by mouth daily.     clopidogrel (PLAVIX) 75 MG tablet Take 1 tablet (75 mg total) by mouth daily with breakfast. 30 tablet 5   Coenzyme Q10 (CO Q-10) 100 MG CAPS Take by mouth 2 (two) times daily.      desonide (DESOWEN) 0.05 % cream Apply 1 application topically 2 (two) times daily as needed.     ezetimibe (ZETIA) 10 MG tablet Take 10 mg by mouth daily.      fluocinonide cream (LIDEX) 0.05 % as needed.     hydrocortisone 2.5 % ointment Apply topically as needed.      mometasone (ELOCON) 0.1 % ointment Apply topically daily as needed. Ear drops     Multiple Vitamin (MULTI-VITAMINS) TABS Take 1 tablet by mouth daily.      nitroGLYCERIN (NITROSTAT) 0.4 MG SL tablet Place 0.4 mg under the tongue every 5 (five) minutes as needed for chest pain.      meloxicam (MOBIC) 15 MG tablet Take 15 mg by mouth daily.     No current facility-administered medications on file prior to visit.    LABS/IMAGING: No results found for this or any previous visit (from the past 48 hours). No results found.  LIPID PANEL: No results found for: "CHOL", "TRIG", "HDL", "CHOLHDL", "VLDL", "LDLCALC", "LDLDIRECT"   WEIGHTS: Wt Readings from Last 3 Encounters:  11/10/23 221 lb 12.8 oz (100.6 kg)  06/21/19 216 lb (98 kg)  10/14/18 214 lb (97.1 kg)    VITALS: BP 118/80 (BP Location: Left Arm, Patient Position: Sitting, Cuff Size: Large)   Pulse 62   Ht 5\' 1"  (1.549 m)   Wt 221 lb 12.8 oz (100.6 kg)   SpO2 97%   BMI 41.91 kg/m   EXAM: General appearance: alert and no distress Lungs: clear to auscultation bilaterally Heart: regular rate and rhythm, S1, S2 normal, no murmur, click, rub or gallop Extremities: extremities normal, atraumatic, no cyanosis or edema Neurologic: Grossly normal  EKG: EKG Interpretation Date/Time:  Monday November 10 2023 11:54:51 EDT Ventricular Rate:  62 PR Interval:  140 QRS Duration:  78 QT Interval:  414 QTC Calculation: 420 R Axis:   59  Text Interpretation: Normal sinus rhythm Nonspecific ST and T wave abnormality When compared with ECG of 01-Aug-2016 05:07, No significant change was found Confirmed by Zoila Shutter [phone]) on 11/10/2023 11:58:24 AM    ASSESSMENT: CAD status post NSTEMI in 2004 with Cypher stents in the mid RCA and mid circumflex Abnormal Myoview with PCI to the mid LAD in November 2017, overlapping resolute stents Dyslipidemia, very high risk, goal LDL less than 55 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   Denise Jenkins has a history of coronary artery disease with 2 episodes of stenting previously.  Since she has older first generation drug-eluting stents, she has been maintained on dual antiplatelet therapy.  There is a very low risk of delayed in-stent thrombosis.  I discussed this with one of my colleagues who felt that it would not be unreasonable to continue her DAPT as long as she was not having any bleeding issues.  With regards to dyslipidemia, I think she remains above target is given very high risk.  I would advise an LDL target of less than 55.  She is already on high intensity statin and ezetimibe.  Given her high LP(a)  it is also reasonable to consider PCSK9 inhibitor.  We discussed starting Repatha today and she seemed agreeable to it.  Will go ahead and reach out for prior authorization and plan to follow-up LP(a) and NMR.  She will follow-up with me for general cardiology and lipid management.  Plan follow-up in 6 months or sooner as necessary.  Chrystie Nose, MD, [facility], FACP  [place]  [facility]  Medical Director of the Advanced Lipid Disorders &  Cardiovascular Risk Reduction Clinic Diplomate of the American Board of Clinical Lipidology Attending Cardiologist  Direct Dial: [phone]  Fax: [phone]  Website:  [email or web address]   Denise Jenkins 11/10/2023, 1:27 PM

## 2023-11-10 NOTE — Addendum Note (Signed)
 Addended by: Lindell Spar on: 11/10/2023 01:48 PM   Modules accepted: Orders

## 2023-11-10 NOTE — Telephone Encounter (Signed)
 Update sent to patient via MyChart

## 2023-11-10 NOTE — Patient Instructions (Signed)
 Medication Instructions:  Dr. Rennis Golden recommends Repatha Sureclick 140mg /ml OR Praluent 150mg /mL (PCSK9). This is an injectable cholesterol medication self-administered once every 14 days. This medication will likely need prior approval with your insurance company, which we will work on. If the medication is not approved initially, we may need to do an appeal with your insurance.   Administer medication in area of fatty tissue such as abdomen, outer thigh, back of upper arm - and rotate site with each injection Store medication in refrigerator until ready to administer - allow to sit at room temp for 30 mins - 1 hour prior to injection Dispose of medication in a SHARPS container - your pharmacy should be able to direct you on this and proper disposal   Patient Assistance:    These foundations have funds at various times.   The PAN Foundation: https://www.panfoundation.org/disease-funds/hypercholesterolemia/ -- can sign up for wait list  The [facility] offers assistance to help pay for medication copays.  They will cover copays for all cholesterol lowering meds, including statins, fibrates, omega-3 fish oils like Vascepa, ezetimibe, Repatha, Praluent, Nexletol, Nexlizet.  The cards are usually good for $2,500 or 12 months, whichever comes first. Our fax # is [phone] (you will need this to apply) Go to healthwellfoundation.org Click on "Apply Now" Answer questions as to whom is applying (patient or representative) Your disease fund will be "hypercholesterolemia - Medicare access" They will ask questions about finances and which medications you are taking for cholesterol When you submit, the approval is usually within minutes.  You will need to print the card information from the site You will need to show this information to your pharmacy, they will bill your Medicare Part D plan first -then bill Health Well --for the copay.   You can also call them at [phone], although the  hold times can be quite long.    *If you need a refill on your cardiac medications before your next appointment, please call your pharmacy*   Lab Work: FASTING lab work in 3-4 months to check cholesterol  NMR lipoprofile and LPa  If you have labs (blood work) drawn today and your tests are completely normal, you will receive your results only by: MyChart Message (if you have MyChart) OR A paper copy in the mail If you have any lab test that is abnormal or we need to change your treatment, we will call you to review the results.   Follow-Up: At [facility], you and your health needs are our priority.  As part of our continuing mission to provide you with exceptional heart care, we have created designated Provider Care Teams.  These Care Teams include your primary Cardiologist (physician) and Advanced Practice Providers (APPs -  Physician Assistants and Nurse Practitioners) who all work together to provide you with the care you need, when you need it.  We recommend signing up for the patient portal called "MyChart".  Sign up information is provided on this After Visit Summary.  MyChart is used to connect with patients for Virtual Visits (Telemedicine).  Patients are able to view lab/test results, encounter notes, upcoming appointments, etc.  Non-urgent messages can be sent to your provider as well.   To learn more about what you can do with MyChart, go to [email or web address].    Your next appointment:    6 months with Dr. Rennis Golden ** call in April or May for next appointment Other Instructions    1st Floor: - Lobby - Registration  - Pharmacy  -  Lab - Cafe  2nd Floor: - PV Lab - Diagnostic Testing (echo, CT, nuclear med)  3rd Floor: - Vacant  4th Floor: - TCTS (cardiothoracic surgery) - AFib Clinic - Structural Heart Clinic - Vascular Surgery  - Vascular Ultrasound  5th Floor: - HeartCare Cardiology (general and EP) - Clinical Pharmacy for coumadin,  hypertension, lipid, weight-loss medications, and med management appointments    Valet parking services will be available as well.

## 2023-11-17 ENCOUNTER — Ambulatory Visit
Admission: RE | Admit: 2023-11-17 | Discharge: 2023-11-17 | Disposition: A | Discharge status: Home / Self Care | Payer: Medicare Other | Source: Ambulatory Visit | Attending: Internal Medicine | Admitting: Internal Medicine

## 2023-11-17 DIAGNOSIS — Z1231 Encounter for screening mammogram for malignant neoplasm of breast: Secondary | ICD-10-CM | POA: Diagnosis present

## 2023-11-20 ENCOUNTER — Other Ambulatory Visit (HOSPITAL_COMMUNITY): Payer: Self-pay

## 2023-11-20 NOTE — Telephone Encounter (Signed)
 Megan with [facility] is following up regarding PA request. She says they need to know if the requested medication, Repatha, will be used to reduce risk of myocardial infarction, stroke, and/or coronary revascularization. She says they also need documentation to support patient's diagnosis. She says if this information is not received by the morning of 3/23 then they will have to administer a denial. She says it would be ideal to have it in by 3/22 or 3/21 since the office will be closed on the weekend.   Phone#: [phone] opt#5  Fax: [phone] case#: [id number]

## 2023-11-20 NOTE — Telephone Encounter (Signed)
 Requested info submitted per PA team

## 2023-11-20 NOTE — Telephone Encounter (Signed)
 Message to PA Rx Team:  Received letter from insurance that Repatha is on drug list but requires a PA. Can only get quantity #2 per 28 days

## 2023-12-12 ENCOUNTER — Encounter: Payer: Self-pay | Admitting: Internal Medicine

## 2024-03-08 ENCOUNTER — Encounter: Payer: Self-pay | Admitting: Internal Medicine

## 2024-03-08 MED ORDER — ATORVASTATIN CALCIUM 40 MG PO TABS: 40.0000 mg | ORAL_TABLET | Freq: Every day | ORAL | 3 refills | Status: AC

## 2024-03-16 LAB — NMR, LIPOPROFILE
Cholesterol, Total: 102 mg/dL (ref 100–199)
HDL Particle Number: 45.1 umol/L (ref >=30.5)
HDL-C: 59 mg/dL (ref >39)
LDL Particle Number: <300 nmol/L (ref <1000)
LDL-C (NIH Calc): 23 mg/dL (ref 0–99)
LP-IR Score: 42 (ref <=45)
Small LDL Particle Number: 107 nmol/L (ref <=527)
Triglycerides: 108 mg/dL (ref 0–149)

## 2024-03-16 LAB — LIPOPROTEIN A (LPA): Lipoprotein (a): 167.9 nmol/L — ABNORMAL HIGH (ref <75.0)

## 2024-03-19 ENCOUNTER — Ambulatory Visit: Payer: Self-pay | Admitting: Internal Medicine

## 2024-04-14 ENCOUNTER — Other Ambulatory Visit: Payer: Self-pay | Admitting: Medical Genetics

## 2024-04-21 ENCOUNTER — Other Ambulatory Visit
Admission: RE | Admit: 2024-04-21 | Discharge: 2024-04-21 | Disposition: A | Discharge status: Home / Self Care | Payer: Self-pay | Source: Ambulatory Visit | Attending: Medical Genetics | Admitting: Medical Genetics

## 2024-04-30 LAB — GENECONNECT MOLECULAR SCREEN: Genetic Analysis Overall Interpretation: NEGATIVE

## 2024-05-13 ENCOUNTER — Encounter: Payer: Self-pay | Admitting: Internal Medicine

## 2024-05-13 ENCOUNTER — Ambulatory Visit: Attending: Internal Medicine | Admitting: Internal Medicine

## 2024-05-13 VITALS — BP 148/80 | HR 54 | Ht 61.0 in | Wt 217.4 lb

## 2024-05-13 DIAGNOSIS — I10 Essential (primary) hypertension: Secondary | ICD-10-CM | POA: Diagnosis not present

## 2024-05-13 DIAGNOSIS — I214 Non-ST elevation (NSTEMI) myocardial infarction: Secondary | ICD-10-CM | POA: Diagnosis not present

## 2024-05-13 DIAGNOSIS — E7841 Elevated Lipoprotein(a): Secondary | ICD-10-CM | POA: Diagnosis not present

## 2024-05-13 DIAGNOSIS — E785 Hyperlipidemia, unspecified: Secondary | ICD-10-CM

## 2024-05-13 DIAGNOSIS — I2510 Atherosclerotic heart disease of native coronary artery without angina pectoris: Secondary | ICD-10-CM

## 2024-05-13 NOTE — Patient Instructions (Signed)
 Medication Instructions:  Your physician recommends that you continue on your current medications as directed. Please refer to the Current Medication list given to you today.  *If you need a refill on your cardiac medications before your next appointment, please call your pharmacy*  Lab Work: None ordered.  If you have labs (blood work) drawn today and your tests are completely normal, you will receive your results only by: MyChart Message (if you have MyChart) OR A paper copy in the mail If you have any lab test that is abnormal or we need to change your treatment, we will call you to review the results.  Testing/Procedures: None ordered.   Follow-Up: At [facility], you and your health needs are our priority.  As part of our continuing mission to provide you with exceptional heart care, our providers are all part of one team.  This team includes your primary Cardiologist (physician) and Advanced Practice Providers or APPs (Physician Assistants and Nurse Practitioners) who all work together to provide you with the care you need, when you need it.  Your next appointment:   12 months with Rosaline Bane, NP

## 2024-05-13 NOTE — Progress Notes (Signed)
 OFFICE NOTE  Chief Complaint:  Follow-up  Primary Care Physician: Corlis Honor BROCKS, MD  HPI:  Denise Jenkins is a 73 y.o. female with a past medial history significant for coronary artery disease with NSTEMI on November 08, 2002.  She has been followed by Dr. Marolyn Dooms. She underwent Cypher stenting to the mid RCA and mid circumflex.  Other medical problems include hypertension, dyslipidemia and elevated LP(a).  Subsequently she had an abnormal Myoview stress test in 2017 and had overlapping resolute stents placed to the mid LAD.  Since then she has done well without any further chest pain or worsening shortness of breath.  Her last echo in December 2021 showed LVEF 50 to 55% with regards to dyslipidemia, she is also been followed by Dr. Zell Crete in [place].  She had a recent lipid NMR on February 11 which showed an LDL particle number of 861, LDL of 72, HDL 65 and triglycerides 118.  Her small LDL particle number was minimally elevated at 539 with predominantly higher small particle number.  She has previously had an LP(a) which was elevated 144.6 nmol/L.  She has been trying to work on weight loss but has some limitations regards to her knees instability to exercise.  She is a former Human resources officer at [employer].  05/13/2024  Denise Jenkins is seen today in follow-up.  She seems to be doing well.  She has been on the Repatha  and had a marked reduction in her lipids.  Her LDL particle number is now less than 300 with an LDL of 23, HDL 57 and triglycerides 108.  LP(a) surprisingly went up slightly to 167.9.  This is possibly drift of the testing and may not be actually related to the medication.  Overall though her cardiovascular risk is thought to be substantially reduced.  We again discussed the fact that she is on dual antiplatelet therapy and that she most likely will remain on that indefinitely unless she has bleeding risk due to her old stents in the past.  PMHx:  Past Medical History:  Diagnosis Date    Arthritis    right knee   Heart attack (HCC) 2004   Hyperlipidemia    Hypertension 2002   Kidney stones    Melanoma (HCC)    Myocardial infarction (HCC) 2004   Other symptoms involving digestive system(787.99)     Past Surgical History:  Procedure Laterality Date   CARDIAC CATHETERIZATION Left 07/31/2016   Procedure: Left Heart Cath and Coronary Angiography;  Surgeon: Marsa Dooms, MD;  Location: [facility] INVASIVE CV LAB;  Service: Cardiovascular;  Laterality: Left;   CARDIAC CATHETERIZATION N/A 07/31/2016   Procedure: Coronary Stent Intervention;  Surgeon: Marsa Dooms, MD;  Location: [facility] INVASIVE CV LAB;  Service: Cardiovascular;  Laterality: N/A;   COLONOSCOPY  2006   COLONOSCOPY WITH PROPOFOL  N/A 10/14/2018   Procedure: COLONOSCOPY WITH BIOPSY;  Surgeon: Unk Corinn Skiff, MD;  Location: [facility];  Service: Endoscopy;  Laterality: N/A;   LITHOTRIPSY  2011   POLYPECTOMY N/A 10/14/2018   Procedure: POLYPECTOMY;  Surgeon: Unk Corinn Skiff, MD;  Location: [facility];  Service: Endoscopy;  Laterality: N/A;   stent placed in heart   2004   non-Q-wave infarction   UPPER GI ENDOSCOPY  2006    FAMHx:  Family History  Problem Relation Age of Onset   Colon polyps Mother    Colon polyps Maternal Uncle     SOCHx:   reports that she has never smoked. She has  never used smokeless tobacco. She reports that she does not drink alcohol and does not use drugs.  ALLERGIES:  Allergies  Allergen Reactions   Latex Hives   Niacin Other (See Comments)   Niacin And Related Itching   Oxycodone Other (See Comments)    Felt drunk   Adhesive [Tape] Rash   Sulfa Antibiotics Itching    ROS: Pertinent items noted in HPI and remainder of comprehensive ROS otherwise negative.  HOME MEDS: Current Outpatient Medications on File Prior to Visit  Medication Sig Dispense Refill   aspirin  81 MG tablet Take 81 mg by mouth daily.     atenolol  (TENORMIN ) 25 MG  tablet Take 25 mg by mouth daily.     atorvastatin  (LIPITOR) 40 MG tablet Take 1 tablet (40 mg total) by mouth daily. 90 tablet 3   cholecalciferol (VITAMIN D) 400 units TABS tablet Take 800 Units by mouth daily.     clopidogrel  (PLAVIX ) 75 MG tablet Take 1 tablet (75 mg total) by mouth daily with breakfast. 30 tablet 5   Coenzyme Q10 (CO Q-10) 100 MG CAPS Take by mouth 2 (two) times daily.      desonide (DESOWEN) 0.05 % cream Apply 1 application topically 2 (two) times daily as needed.     Evolocumab  (REPATHA  SURECLICK) 140 MG/ML SOAJ Inject 140 mg into the skin every 14 (fourteen) days. 2 mL 11   ezetimibe  (ZETIA ) 10 MG tablet Take 10 mg by mouth daily.      fluocinonide cream (LIDEX) 0.05 % as needed.     hydrocortisone 2.5 % ointment Apply topically as needed.     meloxicam  (MOBIC ) 15 MG tablet Take 15 mg by mouth daily.     mometasone (ELOCON) 0.1 % ointment Apply topically daily as needed. Ear drops     Multiple Vitamin (MULTI-VITAMINS) TABS Take 1 tablet by mouth daily.      nitroGLYCERIN  (NITROSTAT ) 0.4 MG SL tablet Place 0.4 mg under the tongue every 5 (five) minutes as needed for chest pain.      No current facility-administered medications on file prior to visit.    LABS/IMAGING: No results found for this or any previous visit (from the past 48 hours). No results found.  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   Denise Jenkins seems to be doing well and is tolerating Repatha  with a marked improvement in her lipids.  She had mild increase in LP(a).  She would not qualify for any trials at this time.  In the future she might be a candidate for that.  We have agreed to continue dual antiplatelet therapy given her very old stents with a small but not and possible chance of in-stent thrombosis.  So far she has had some bruising but no active bleeding.  Blood  pressure was elevated today however your reports home blood pressure readings are better controlled.  No changes in her meds today.  Plan follow-up with APP in annually or sooner as necessary.  Denise KYM Maxcy, MD, [facility], FNLA, FACP  [facility]  Medical Director of the Advanced Lipid Disorders &  Cardiovascular Risk Reduction Clinic Diplomate of the American Board of Clinical Lipidology Attending Cardiologist  Direct Dial: [phone]  Fax: [phone]  Website:  [email or web address] Denise Jenkins Denise Jenkins 05/13/2024, 10:36 AM

## 2024-05-22 ENCOUNTER — Encounter: Payer: Self-pay | Admitting: Internal Medicine

## 2024-05-25 ENCOUNTER — Other Ambulatory Visit: Payer: Self-pay | Admitting: Family

## 2024-05-25 ENCOUNTER — Encounter: Payer: Self-pay | Admitting: Family

## 2024-05-25 DIAGNOSIS — Z13820 Encounter for screening for osteoporosis: Secondary | ICD-10-CM

## 2024-05-26 ENCOUNTER — Other Ambulatory Visit: Payer: Self-pay | Admitting: Internal Medicine

## 2024-05-26 MED ORDER — ATENOLOL 25 MG PO TABS: 25.0000 mg | ORAL_TABLET | Freq: Every day | ORAL | 3 refills | Status: AC

## 2024-05-26 NOTE — Telephone Encounter (Signed)
 Pt is requesting a refill on medication atenolol . This medication has not been refilled since 2020. Would Dr. Mona like to refill this medication? Please address

## 2024-05-26 NOTE — Telephone Encounter (Signed)
*  STAT* If patient is at the pharmacy, call can be transferred to refill team.   1. Which medications need to be refilled? (please list name of each medication and dose if known)   atenolol  (TENORMIN ) 25 MG tablet    2. Which pharmacy/location (including street and city if local pharmacy) is medication to be sent to? [facility] - [place], [place] - [address]   3. Do they need a 30 day or 90 day supply? 90

## 2024-05-29 ENCOUNTER — Encounter: Payer: Self-pay | Admitting: Internal Medicine

## 2024-05-31 MED ORDER — CLOPIDOGREL BISULFATE 75 MG PO TABS: 75.0000 mg | ORAL_TABLET | Freq: Every day | ORAL | 2 refills | Status: AC

## 2024-06-24 ENCOUNTER — Other Ambulatory Visit (HOSPITAL_COMMUNITY): Payer: Self-pay

## 2024-06-24 ENCOUNTER — Telehealth: Payer: Self-pay | Admitting: Pharmacy Technician

## 2024-06-24 NOTE — Telephone Encounter (Signed)
 Patient Advocate Encounter   The patient was approved for a [employer] grant that will help cover the cost of repatha  Total amount awarded, 2500.  Effective: 05/25/24 - 05/24/25   APW:[id number] ERW:[id number] Hmnle:[id number] PI:[id number] [employer] ID: [id number]   Pharmacy provided with approval and processing information. Patient informed via mychart

## 2024-06-28 ENCOUNTER — Ambulatory Visit
Admission: RE | Admit: 2024-06-28 | Discharge: 2024-06-28 | Disposition: A | Discharge status: Home / Self Care | Source: Ambulatory Visit | Attending: Family | Admitting: Family

## 2024-06-28 DIAGNOSIS — M8580 Other specified disorders of bone density and structure, unspecified site: Secondary | ICD-10-CM | POA: Insufficient documentation

## 2024-06-28 DIAGNOSIS — Z780 Asymptomatic menopausal state: Secondary | ICD-10-CM | POA: Insufficient documentation

## 2024-06-28 DIAGNOSIS — Z13820 Encounter for screening for osteoporosis: Secondary | ICD-10-CM | POA: Diagnosis present

## 2024-10-08 ENCOUNTER — Other Ambulatory Visit: Payer: Self-pay | Admitting: Family

## 2024-10-08 DIAGNOSIS — Z1231 Encounter for screening mammogram for malignant neoplasm of breast: Secondary | ICD-10-CM

## 2024-11-17 ENCOUNTER — Encounter
